# Patient Record
Sex: Female | Born: 1980
Health system: Southern US, Community
[De-identification: ages and names within clinical notes are randomized; demographics above are authoritative.]

## PROBLEM LIST (undated history)

## (undated) ENCOUNTER — Inpatient Hospital Stay (HOSPITAL_COMMUNITY): Payer: Self-pay

## (undated) DIAGNOSIS — Z789 Other specified health status: Secondary | ICD-10-CM

## (undated) DIAGNOSIS — IMO0002 Reserved for concepts with insufficient information to code with codable children: Secondary | ICD-10-CM

## (undated) DIAGNOSIS — Z8279 Family history of other congenital malformations, deformations and chromosomal abnormalities: Secondary | ICD-10-CM

## (undated) DIAGNOSIS — K649 Unspecified hemorrhoids: Secondary | ICD-10-CM

## (undated) DIAGNOSIS — O24419 Gestational diabetes mellitus in pregnancy, unspecified control: Secondary | ICD-10-CM

## (undated) DIAGNOSIS — Z98891 History of uterine scar from previous surgery: Secondary | ICD-10-CM

## (undated) DIAGNOSIS — A630 Anogenital (venereal) warts: Secondary | ICD-10-CM

## (undated) DIAGNOSIS — D62 Acute posthemorrhagic anemia: Secondary | ICD-10-CM

## (undated) DIAGNOSIS — K08409 Partial loss of teeth, unspecified cause, unspecified class: Secondary | ICD-10-CM

## (undated) HISTORY — PX: WISDOM TOOTH EXTRACTION: SHX21

---

## 2000-12-17 ENCOUNTER — Other Ambulatory Visit: Admission: RE | Admit: 2000-12-17 | Discharge: 2000-12-17 | Payer: Self-pay | Admitting: Obstetrics and Gynecology

## 2001-04-20 ENCOUNTER — Encounter (INDEPENDENT_AMBULATORY_CARE_PROVIDER_SITE_OTHER): Payer: Self-pay

## 2001-04-20 ENCOUNTER — Other Ambulatory Visit: Admission: RE | Admit: 2001-04-20 | Discharge: 2001-04-20 | Payer: Self-pay | Admitting: Obstetrics and Gynecology

## 2002-05-18 ENCOUNTER — Other Ambulatory Visit: Admission: RE | Admit: 2002-05-18 | Discharge: 2002-05-18 | Payer: Self-pay | Admitting: Obstetrics and Gynecology

## 2003-04-22 ENCOUNTER — Other Ambulatory Visit: Admission: RE | Admit: 2003-04-22 | Discharge: 2003-04-22 | Payer: Self-pay | Admitting: Obstetrics and Gynecology

## 2004-03-26 ENCOUNTER — Ambulatory Visit (HOSPITAL_COMMUNITY): Admission: RE | Admit: 2004-03-26 | Discharge: 2004-03-26 | Payer: Self-pay | Admitting: Obstetrics and Gynecology

## 2004-03-28 ENCOUNTER — Other Ambulatory Visit: Admission: RE | Admit: 2004-03-28 | Discharge: 2004-03-28 | Payer: Self-pay | Admitting: Obstetrics and Gynecology

## 2004-10-12 ENCOUNTER — Inpatient Hospital Stay (HOSPITAL_COMMUNITY): Admission: AD | Admit: 2004-10-12 | Discharge: 2004-10-14 | Payer: Self-pay | Admitting: Obstetrics and Gynecology

## 2006-05-26 ENCOUNTER — Encounter: Admission: RE | Admit: 2006-05-26 | Discharge: 2006-07-10 | Payer: Self-pay | Admitting: Obstetrics and Gynecology

## 2006-07-11 ENCOUNTER — Inpatient Hospital Stay (HOSPITAL_COMMUNITY): Admission: AD | Admit: 2006-07-11 | Discharge: 2006-07-14 | Payer: Self-pay | Admitting: Obstetrics and Gynecology

## 2007-12-17 ENCOUNTER — Encounter: Admission: RE | Admit: 2007-12-17 | Discharge: 2007-12-17 | Payer: Self-pay | Admitting: Obstetrics and Gynecology

## 2007-12-29 ENCOUNTER — Encounter: Admission: RE | Admit: 2007-12-29 | Discharge: 2007-12-29 | Payer: Self-pay | Admitting: Obstetrics and Gynecology

## 2008-01-20 ENCOUNTER — Encounter: Admission: RE | Admit: 2008-01-20 | Discharge: 2008-01-20 | Payer: Self-pay | Admitting: Obstetrics and Gynecology

## 2008-08-08 ENCOUNTER — Ambulatory Visit: Payer: Self-pay | Admitting: Internal Medicine

## 2009-06-01 ENCOUNTER — Inpatient Hospital Stay (HOSPITAL_COMMUNITY): Admission: AD | Admit: 2009-06-01 | Discharge: 2009-06-01 | Payer: Self-pay | Admitting: Obstetrics and Gynecology

## 2009-06-09 ENCOUNTER — Inpatient Hospital Stay (HOSPITAL_COMMUNITY): Admission: AD | Admit: 2009-06-09 | Discharge: 2009-06-11 | Payer: Self-pay | Admitting: Obstetrics and Gynecology

## 2010-11-11 ENCOUNTER — Encounter: Payer: Self-pay | Admitting: Obstetrics and Gynecology

## 2011-01-26 LAB — CBC
HCT: 23.9 % — ABNORMAL LOW (ref 36.0–46.0)
Platelets: 388 10*3/uL (ref 150–400)
RBC: 3.52 MIL/uL — ABNORMAL LOW (ref 3.87–5.11)
RDW: 18.8 % — ABNORMAL HIGH (ref 11.5–15.5)
RDW: 18.8 % — ABNORMAL HIGH (ref 11.5–15.5)
WBC: 13.6 10*3/uL — ABNORMAL HIGH (ref 4.0–10.5)
WBC: 14 10*3/uL — ABNORMAL HIGH (ref 4.0–10.5)

## 2011-01-26 LAB — RPR: RPR Ser Ql: NONREACTIVE

## 2011-03-08 NOTE — Op Note (Signed)
NAME:  Danielle Lambert, Danielle Lambert NO.:  [id number]   MEDICAL RECORD NO.:  [id number]          PATIENT TYPE:  INP   LOCATION:  9132                          FACILITY:  [facility]   PHYSICIAN:  Maxie Better, M.D.DATE OF BIRTH:  06-30-1981   DATE OF PROCEDURE:  07/11/2006  DATE OF DISCHARGE:  07/14/2006                                 OPERATIVE REPORT   PREOPERATIVE DIAGNOSIS:  1. Fetal macrosomia.  2. Class A1 gestational diabetes.  3. Term gestation.   PROCEDURE:  1. Primary cesarean section  Kerr hysterotomy.   POSTOP DIAGNOSIS:  1. Fetal macrosomia.  2. Class A1 gestational diabetes.  3. Term gestation.   ANESTHESIA:  Spinal.   SURGEON:  Maxie Better, M.D.   ASSISTANT:  Marlinda Mike, C.N.M.   INDICATIONS:  A 30 year old gravida 2, para 1 female with class A1  gestational diabetes at term with an ultrasound estimated fetal weight of  4462 grams who now presents for surgical management to decrease her risk of  shoulder dystocia.  Risks and benefits of the procedure have been explained  to the patient's; consent was signed; the patient was transferred to the  operating room.   DESCRIPTION OF PROCEDURE:  Under adequate spinal anesthesia the patient was  placed in the supine position with a left lateral tilt.  She was sterilely  prepped and draped in the usual fashion.  An indwelling Foley catheter was  sterilely placed.  A Pfannenstiel skin incision was then made after 1/4%  Marcaine was injected.  The incision was carried down to the rectus fascia.  The rectus fascia was opened transversely.  The rectus fascia was then  bluntly and sharply dissected off the rectus muscle in the superior and  inferior fashion.  The rectus muscles were entered in the midline.  The  parietal peritoneum was entered sharply and extended superiorly and  inferiorly.   The lower uterine segment which was well-developed was palpated.  The  vesicouterine peritoneum was opened  transversely.  The bladder was then  bluntly dissected off the lower uterine segment and displaced inferiorly.  A  curvilinear low-transverse uterine incision was then made, extended with  bandage scissors.  Artificial rupture of membranes was performed.  Clear  amniotic fluid was noted.  Subsequent delivery of a live female was then  accomplished.  The baby was bulb suctioned on the abdomen; the cord was  clamped, cut.  The baby was transferred to the awaiting pediatrician who  assigned Apgars of 9 and 9 at one and five minutes.  Weight of the baby was  8 pounds 14 ounces.  The placenta was manually removed.  Uterine cavity was  then cleaned of debris.  Uterine incision had no extension.   Uterine incision was closed in two layers; the first layer with #0 Monocryl  running locked stitch.  The second layer was imbricating  using #0 Monocryl  suture.  Good hemostasis was subsequently noted.  Normal tubes and ovaries  were noted bilaterally.  The abdomen was copiously irrigated and suctioned  of debris.  The parietal peritoneum was closed with 2-0 Vicryl.  The rectus  fascia was closed with #0 Vicryl x2.  The subcutaneous areas irrigated,  small bleeders cauterized, interrupted 2-0 plain sutures were then placed;  and Ethicon staples were then used to approximate the incision.  Specimen  was placenta not sent PT pathology.  Estimated blood loss was 700 mL.  Intraoperative fluid was 3 liters.  Urine output was 100 mL clear yellow  urine.  Sponge and sponge counts x2 were correct.  Complications none.  The  patient tolerated the procedure well; was transferred to recovery in stable  condition.      Maxie Better, M.D.  Electronically Signed     /MEDQ  D:  07/11/2006  T:  07/14/2006  Job:  [id number]

## 2011-03-08 NOTE — Discharge Summary (Signed)
NAME:  Danielle Lambert, Danielle Lambert NO.:  [id number]   MEDICAL RECORD NO.:  [id number]          PATIENT TYPE:  INP   LOCATION:  9132                          FACILITY:  [facility]   PHYSICIAN:  Maxie Better, M.D.DATE OF BIRTH:  Mar 06, 1981   DATE OF ADMISSION:  07/11/2006  DATE OF DISCHARGE:  07/14/2006                                 DISCHARGE SUMMARY   ADMISSION DIAGNOSES:  1. Fetal macrosomia.  2. Class A-1 gestational diabetes.  3. Iron-deficiency anemia.  4. Intrauterine gestation at 39 weeks.  5. Iron-deficiency anemia   DISCHARGE DIAGNOSES:  1. Term gestation delivered.  2. Class A-1 gestational diabetes.  3. Fetal macrosomia.  4. Iron-deficiency anemia.   PROCEDURE:  1. Primary cesarean section.  2. Sharl Ma hysterotomy.   HISTORY OF PRESENT ILLNESS:  A 30 year old gravida 2, para 1-0-0-1 female at  29 weeks' with class A-1 gestational diabetes, diet-controlled, who was  found by ultrasound to have an estimated fetal weight of 4462 g.   HOSPITAL COURSE:  Based on the ultrasound findings, the patient was admitted  to [facility] for a primary cesarean section as a result of her  gestational diabetes.  The patient underwent an uncomplicated primary  cesarean section.  The procedure resulted in the delivery of an 8 pound 14  ounce live female with Apgar of 9 and 9.  The patient had an estimated blood  loss of 700 cc.  She had an admission hematocrit of 29.2, hemoglobin 9.6,  white count of 11.1, platelet count of 369,000.  Postoperatively, her  hemoglobin was 7.2.  Repeat CBC subsequently showed a level of 7.8 on her  hemoglobin.  The patient initially had some dizziness with positional  change.  She has, though, complained of some fatigue.  The patient otherwise  was tolerating a regular diet, had passed flatus.  On postop day #1, the  patient was started on iron supplementation.  The patient subsequently did  not require a blood transfusion.  On postop day #3,  the patient had no  evidence of tachycardia.  Her incision showed no areas of erythema,  induration, or exudate, and having ambulated without assistance or  dizziness, the patient was deemed well to be discharged home.   DISPOSITION:  Home.   CONDITION:  Stable.   DISCHARGE MEDICATIONS:  1. Ibuprofen 800 mg one every 8 hours p.r.n. pain.  2. Percocet 1 to 2 tablets every 4 to 6 hours p.r.n. pain.  3. Prenatal vitamins 1 p.o. daily.  4. Niferex 150 mg 1 p.o. b.i.d.   Followup appointment with OB/GYN in 6 weeks.   DISCHARGE INSTRUCTIONS:  Per the postpartum book I have given.  The patient  will have a 2-hour glucose tolerance test at 8 weeks' postpartum.      Maxie Better, M.D.  Electronically Signed     [place]/MEDQ  D:  08/20/2006  T:  08/21/2006  Job:  [id number]

## 2011-04-22 LAB — ABO/RH: RH Type: POSITIVE

## 2011-04-22 LAB — HIV ANTIBODY (ROUTINE TESTING W REFLEX): HIV: NONREACTIVE

## 2011-04-22 LAB — RUBELLA ANTIBODY, IGM: Rubella: IMMUNE

## 2011-05-06 ENCOUNTER — Other Ambulatory Visit: Payer: Self-pay | Admitting: Obstetrics and Gynecology

## 2011-05-06 DIAGNOSIS — R1011 Right upper quadrant pain: Secondary | ICD-10-CM

## 2011-05-06 LAB — GC/CHLAMYDIA PROBE AMP, GENITAL
Chlamydia: NEGATIVE
Gonorrhea: NEGATIVE

## 2011-05-07 ENCOUNTER — Ambulatory Visit
Admission: RE | Admit: 2011-05-07 | Discharge: 2011-05-07 | Disposition: A | Discharge status: Home / Self Care | Payer: BC Managed Care – PPO | Source: Ambulatory Visit | Attending: Obstetrics and Gynecology | Admitting: Obstetrics and Gynecology

## 2011-05-07 DIAGNOSIS — R1011 Right upper quadrant pain: Secondary | ICD-10-CM

## 2011-10-22 NOTE — L&D Delivery Note (Signed)
Delivery Note At 1:18 PM a viable and healthy female was delivered via Vaginal, Spontaneous Delivery (Presentation: Left Occiput Posterior).  APGAR: 9, 9; weight 8 lb 6.2 oz (3805 g).   Placenta status: Intact, Spontaneous.  Cord: 3 vessels with the following complications: None.  Cord pH: none  Anesthesia: Epidural  Episiotomy: None Lacerations: None Suture Repair: none Est. Blood Loss (mL): 200cc  Mom to postpartum.  Baby to nursery-stable.  Adelfa Lozito A 11/15/2011, 4:30 PM

## 2011-11-15 ENCOUNTER — Encounter (HOSPITAL_COMMUNITY): Payer: Self-pay | Admitting: Anesthesiology

## 2011-11-15 ENCOUNTER — Encounter (HOSPITAL_COMMUNITY): Payer: Self-pay | Admitting: *Deleted

## 2011-11-15 ENCOUNTER — Encounter (HOSPITAL_COMMUNITY)
Admission: AD | Disposition: A | Discharge status: Home / Self Care | Payer: Self-pay | Source: Home / Self Care | Attending: Obstetrics and Gynecology

## 2011-11-15 ENCOUNTER — Inpatient Hospital Stay (HOSPITAL_COMMUNITY): Payer: BC Managed Care – PPO | Admitting: Anesthesiology

## 2011-11-15 ENCOUNTER — Inpatient Hospital Stay (HOSPITAL_COMMUNITY)
Admission: AD | Admit: 2011-11-15 | Discharge: 2011-11-17 | DRG: 373 | Disposition: A | Discharge status: Home / Self Care | Payer: BC Managed Care – PPO | Attending: Obstetrics and Gynecology | Admitting: Obstetrics and Gynecology

## 2011-11-15 DIAGNOSIS — D509 Iron deficiency anemia, unspecified: Secondary | ICD-10-CM | POA: Diagnosis present

## 2011-11-15 DIAGNOSIS — O9902 Anemia complicating childbirth: Secondary | ICD-10-CM | POA: Diagnosis present

## 2011-11-15 DIAGNOSIS — O34219 Maternal care for unspecified type scar from previous cesarean delivery: Secondary | ICD-10-CM | POA: Diagnosis present

## 2011-11-15 HISTORY — DX: Other specified health status: Z78.9

## 2011-11-15 LAB — CBC
HCT: 29.1 % — ABNORMAL LOW (ref 36.0–46.0)
MCH: 24.1 pg — ABNORMAL LOW (ref 26.0–34.0)
MCV: 78 fL (ref 78.0–100.0)
RBC: 3.73 MIL/uL — ABNORMAL LOW (ref 3.87–5.11)
WBC: 11.8 10*3/uL — ABNORMAL HIGH (ref 4.0–10.5)

## 2011-11-15 SURGERY — Surgical Case
Anesthesia: Regional

## 2011-11-15 MED ORDER — LIDOCAINE HCL (PF) 1 % IJ SOLN: 30.0000 mL | INTRAMUSCULAR | Status: DC | PRN

## 2011-11-15 MED ORDER — LACTATED RINGERS IV SOLN: INTRAVENOUS | Status: DC

## 2011-11-15 MED ORDER — IBUPROFEN 600 MG PO TABS
600.0000 mg | ORAL_TABLET | Freq: Four times a day (QID) | ORAL | Status: DC | PRN
  Administered 2011-11-15: 600 mg via ORAL
  Filled 2011-11-15: qty 1

## 2011-11-15 MED ORDER — OXYCODONE-ACETAMINOPHEN 5-325 MG PO TABS: 2.0000 | ORAL_TABLET | ORAL | Status: DC | PRN

## 2011-11-15 MED ORDER — MORPHINE SULFATE 0.5 MG/ML IJ SOLN
INTRAMUSCULAR | Status: AC
  Filled 2011-11-15: qty 10

## 2011-11-15 MED ORDER — PHENYLEPHRINE 40 MCG/ML (10ML) SYRINGE FOR IV PUSH (FOR BLOOD PRESSURE SUPPORT): 80.0000 ug | PREFILLED_SYRINGE | INTRAVENOUS | Status: DC | PRN

## 2011-11-15 MED ORDER — LACTATED RINGERS IV SOLN: 500.0000 mL | INTRAVENOUS | Status: DC | PRN

## 2011-11-15 MED ORDER — FLEET ENEMA 7-19 GM/118ML RE ENEM: 1.0000 | ENEMA | RECTAL | Status: DC | PRN

## 2011-11-15 MED ORDER — OXYTOCIN BOLUS FROM INFUSION
500.0000 mL | Freq: Once | INTRAVENOUS | Status: DC
  Filled 2011-11-15: qty 500

## 2011-11-15 MED ORDER — OXYTOCIN 20 UNITS IN LACTATED RINGERS INFUSION - SIMPLE
1.0000 m[IU]/min | INTRAVENOUS | Status: DC
  Administered 2011-11-15: 2 m[IU]/min via INTRAVENOUS
  Filled 2011-11-15: qty 1000

## 2011-11-15 MED ORDER — CITRIC ACID-SODIUM CITRATE 334-500 MG/5ML PO SOLN
30.0000 mL | ORAL | Status: DC | PRN
  Administered 2011-11-15: 30 mL via ORAL

## 2011-11-15 MED ORDER — ONDANSETRON HCL 4 MG/2ML IJ SOLN
INTRAMUSCULAR | Status: AC
  Filled 2011-11-15: qty 2

## 2011-11-15 MED ORDER — TERBUTALINE SULFATE 1 MG/ML IJ SOLN: 0.2500 mg | Freq: Once | INTRAMUSCULAR | Status: DC | PRN

## 2011-11-15 MED ORDER — DIPHENHYDRAMINE HCL 25 MG PO CAPS: 25.0000 mg | ORAL_CAPSULE | Freq: Four times a day (QID) | ORAL | Status: DC | PRN

## 2011-11-15 MED ORDER — WITCH HAZEL-GLYCERIN EX PADS: 1.0000 "application " | MEDICATED_PAD | CUTANEOUS | Status: DC | PRN

## 2011-11-15 MED ORDER — IBUPROFEN 600 MG PO TABS: 600.0000 mg | ORAL_TABLET | Freq: Four times a day (QID) | ORAL | Status: DC | PRN

## 2011-11-15 MED ORDER — DIPHENHYDRAMINE HCL 50 MG/ML IJ SOLN: 12.5000 mg | INTRAMUSCULAR | Status: DC | PRN

## 2011-11-15 MED ORDER — DIBUCAINE 1 % RE OINT: 1.0000 "application " | TOPICAL_OINTMENT | RECTAL | Status: DC | PRN

## 2011-11-15 MED ORDER — PRENATAL MULTIVITAMIN CH
1.0000 | ORAL_TABLET | Freq: Every day | ORAL | Status: DC
  Administered 2011-11-16 – 2011-11-17 (×2): 1 via ORAL
  Filled 2011-11-15: qty 1

## 2011-11-15 MED ORDER — LACTATED RINGERS IV SOLN: 500.0000 mL | Freq: Once | INTRAVENOUS | Status: DC

## 2011-11-15 MED ORDER — ONDANSETRON HCL 4 MG/2ML IJ SOLN: 4.0000 mg | Freq: Four times a day (QID) | INTRAMUSCULAR | Status: DC | PRN

## 2011-11-15 MED ORDER — TETANUS-DIPHTH-ACELL PERTUSSIS 5-2.5-18.5 LF-MCG/0.5 IM SUSP
0.5000 mL | Freq: Once | INTRAMUSCULAR | Status: AC
  Administered 2011-11-16: 0.5 mL via INTRAMUSCULAR
  Filled 2011-11-15: qty 0.5

## 2011-11-15 MED ORDER — EPHEDRINE 5 MG/ML INJ: 10.0000 mg | INTRAVENOUS | Status: DC | PRN

## 2011-11-15 MED ORDER — CITRIC ACID-SODIUM CITRATE 334-500 MG/5ML PO SOLN
30.0000 mL | ORAL | Status: DC | PRN
  Filled 2011-11-15: qty 15

## 2011-11-15 MED ORDER — LACTATED RINGERS IV SOLN
INTRAVENOUS | Status: DC
  Administered 2011-11-15 (×2): via INTRAVENOUS

## 2011-11-15 MED ORDER — SODIUM BICARBONATE 8.4 % IV SOLN
INTRAVENOUS | Status: DC | PRN
  Administered 2011-11-15: 10 mL via EPIDURAL

## 2011-11-15 MED ORDER — SIMETHICONE 80 MG PO CHEW: 80.0000 mg | CHEWABLE_TABLET | ORAL | Status: DC | PRN

## 2011-11-15 MED ORDER — OXYTOCIN 10 UNIT/ML IJ SOLN
INTRAMUSCULAR | Status: AC
  Filled 2011-11-15: qty 2

## 2011-11-15 MED ORDER — BENZOCAINE-MENTHOL 20-0.5 % EX AERO: 1.0000 "application " | INHALATION_SPRAY | CUTANEOUS | Status: DC | PRN

## 2011-11-15 MED ORDER — SENNOSIDES-DOCUSATE SODIUM 8.6-50 MG PO TABS
2.0000 | ORAL_TABLET | Freq: Every day | ORAL | Status: DC
  Administered 2011-11-15 – 2011-11-16 (×2): 2 via ORAL

## 2011-11-15 MED ORDER — EPHEDRINE 5 MG/ML INJ
10.0000 mg | INTRAVENOUS | Status: DC | PRN
  Filled 2011-11-15: qty 4

## 2011-11-15 MED ORDER — ACETAMINOPHEN 325 MG PO TABS: 650.0000 mg | ORAL_TABLET | ORAL | Status: DC | PRN

## 2011-11-15 MED ORDER — OXYTOCIN 20 UNITS IN LACTATED RINGERS INFUSION - SIMPLE: 125.0000 mL/h | Freq: Once | INTRAVENOUS | Status: DC

## 2011-11-15 MED ORDER — BENZOCAINE-MENTHOL 20-0.5 % EX AERO
INHALATION_SPRAY | CUTANEOUS | Status: AC
  Filled 2011-11-15: qty 56

## 2011-11-15 MED ORDER — SODIUM BICARBONATE 8.4 % IV SOLN
INTRAVENOUS | Status: AC
  Filled 2011-11-15: qty 50

## 2011-11-15 MED ORDER — FENTANYL 2.5 MCG/ML BUPIVACAINE 1/10 % EPIDURAL INFUSION (WH - ANES)
14.0000 mL/h | INTRAMUSCULAR | Status: DC
  Administered 2011-11-15: 16 mL/h via EPIDURAL
  Filled 2011-11-15 (×2): qty 60

## 2011-11-15 MED ORDER — LIDOCAINE HCL 1.5 % IJ SOLN
INTRAMUSCULAR | Status: DC | PRN
  Administered 2011-11-15 (×3): 4 mL via INTRADERMAL

## 2011-11-15 MED ORDER — LACTATED RINGERS IV SOLN
INTRAVENOUS | Status: DC | PRN
  Administered 2011-11-15 (×2): via INTRAVENOUS

## 2011-11-15 MED ORDER — PROPOFOL 10 MG/ML IV EMUL
INTRAVENOUS | Status: AC
  Filled 2011-11-15: qty 20

## 2011-11-15 MED ORDER — OXYTOCIN 20 UNITS IN LACTATED RINGERS INFUSION - SIMPLE
125.0000 mL/h | Freq: Once | INTRAVENOUS | Status: AC
  Administered 2011-11-15: 125 mL/h via INTRAVENOUS

## 2011-11-15 MED ORDER — OXYCODONE-ACETAMINOPHEN 5-325 MG PO TABS
1.0000 | ORAL_TABLET | ORAL | Status: DC | PRN
  Administered 2011-11-15 – 2011-11-16 (×4): 1 via ORAL
  Filled 2011-11-15 (×4): qty 1

## 2011-11-15 MED ORDER — ONDANSETRON HCL 4 MG PO TABS: 4.0000 mg | ORAL_TABLET | ORAL | Status: DC | PRN

## 2011-11-15 MED ORDER — OXYTOCIN 20 UNITS IN LACTATED RINGERS INFUSION - SIMPLE: 1.0000 m[IU]/min | INTRAVENOUS | Status: DC

## 2011-11-15 MED ORDER — LIDOCAINE-EPINEPHRINE (PF) 2 %-1:200000 IJ SOLN
INTRAMUSCULAR | Status: AC
  Filled 2011-11-15: qty 20

## 2011-11-15 MED ORDER — PHENYLEPHRINE 40 MCG/ML (10ML) SYRINGE FOR IV PUSH (FOR BLOOD PRESSURE SUPPORT)
80.0000 ug | PREFILLED_SYRINGE | INTRAVENOUS | Status: DC | PRN
  Filled 2011-11-15: qty 5

## 2011-11-15 MED ORDER — LIDOCAINE HCL (PF) 1 % IJ SOLN
30.0000 mL | INTRAMUSCULAR | Status: DC | PRN
  Filled 2011-11-15: qty 30

## 2011-11-15 MED ORDER — OXYCODONE-ACETAMINOPHEN 5-325 MG PO TABS
2.0000 | ORAL_TABLET | ORAL | Status: DC | PRN
  Filled 2011-11-15: qty 1

## 2011-11-15 MED ORDER — IBUPROFEN 600 MG PO TABS
600.0000 mg | ORAL_TABLET | Freq: Four times a day (QID) | ORAL | Status: DC
  Administered 2011-11-15 – 2011-11-17 (×5): 600 mg via ORAL
  Filled 2011-11-15 (×5): qty 1

## 2011-11-15 MED ORDER — FENTANYL CITRATE 0.05 MG/ML IJ SOLN
INTRAMUSCULAR | Status: AC
  Filled 2011-11-15: qty 2

## 2011-11-15 MED ORDER — FENTANYL 2.5 MCG/ML BUPIVACAINE 1/10 % EPIDURAL INFUSION (WH - ANES)
14.0000 mL/h | INTRAMUSCULAR | Status: DC
  Administered 2011-11-15: 14 mL/h via EPIDURAL
  Filled 2011-11-15 (×2): qty 60

## 2011-11-15 MED ORDER — LANOLIN HYDROUS EX OINT: TOPICAL_OINTMENT | CUTANEOUS | Status: DC | PRN

## 2011-11-15 MED ORDER — ONDANSETRON HCL 4 MG/2ML IJ SOLN: 4.0000 mg | INTRAMUSCULAR | Status: DC | PRN

## 2011-11-15 MED ORDER — ZOLPIDEM TARTRATE 5 MG PO TABS: 5.0000 mg | ORAL_TABLET | Freq: Every evening | ORAL | Status: DC | PRN

## 2011-11-15 SURGICAL SUPPLY — 42 items
APL SKNCLS STERI-STRIP NONHPOA (GAUZE/BANDAGES/DRESSINGS)
BENZOIN TINCTURE PRP APPL 2/3 (GAUZE/BANDAGES/DRESSINGS) IMPLANT
CHLORAPREP W/TINT 26ML (MISCELLANEOUS) ×2 IMPLANT
CLOTH BEACON ORANGE TIMEOUT ST (SAFETY) ×2 IMPLANT
CONTAINER PREFILL 10% NBF 15ML (MISCELLANEOUS) IMPLANT
DRESSING TELFA 8X3 (GAUZE/BANDAGES/DRESSINGS) ×2 IMPLANT
ELECT REM PT RETURN 9FT ADLT (ELECTROSURGICAL) ×2
ELECTRODE REM PT RTRN 9FT ADLT (ELECTROSURGICAL) ×1 IMPLANT
EXTRACTOR VACUUM KIWI (MISCELLANEOUS) IMPLANT
EXTRACTOR VACUUM M CUP 4 TUBE (SUCTIONS) IMPLANT
GAUZE SPONGE 4X4 12PLY STRL LF (GAUZE/BANDAGES/DRESSINGS) ×4 IMPLANT
GLOVE BIO SURGEON STRL SZ 6.5 (GLOVE) ×2 IMPLANT
GLOVE BIOGEL PI IND STRL 7.0 (GLOVE) ×2 IMPLANT
GLOVE BIOGEL PI INDICATOR 7.0 (GLOVE) ×2
GOWN PREVENTION PLUS LG XLONG (DISPOSABLE) ×6 IMPLANT
KIT ABG SYR 3ML LUER SLIP (SYRINGE) IMPLANT
NDL HYPO 25X1 1.5 SAFETY (NEEDLE) ×1 IMPLANT
NDL HYPO 25X5/8 SAFETYGLIDE (NEEDLE) IMPLANT
NEEDLE HYPO 25X1 1.5 SAFETY (NEEDLE) ×2 IMPLANT
NEEDLE HYPO 25X5/8 SAFETYGLIDE (NEEDLE) IMPLANT
NS IRRIG 1000ML POUR BTL (IV SOLUTION) ×2 IMPLANT
PACK C SECTION WH (CUSTOM PROCEDURE TRAY) ×2 IMPLANT
PAD ABD 7.5X8 STRL (GAUZE/BANDAGES/DRESSINGS) ×2 IMPLANT
RTRCTR C-SECT PINK 25CM LRG (MISCELLANEOUS) IMPLANT
SLEEVE SCD COMPRESS KNEE MED (MISCELLANEOUS) IMPLANT
STAPLER VISISTAT 35W (STAPLE) IMPLANT
STRIP CLOSURE SKIN 1/2X4 (GAUZE/BANDAGES/DRESSINGS) IMPLANT
SUT CHROMIC GUT AB #0 18 (SUTURE) IMPLANT
SUT MNCRL 0 VIOLET CTX 36 (SUTURE) ×3 IMPLANT
SUT MON AB 4-0 PS1 27 (SUTURE) IMPLANT
SUT MONOCRYL 0 CTX 36 (SUTURE) ×3
SUT PLAIN 2 0 (SUTURE)
SUT PLAIN ABS 2-0 CT1 27XMFL (SUTURE) IMPLANT
SUT VIC AB 0 CT1 27 (SUTURE) ×4
SUT VIC AB 0 CT1 27XBRD ANBCTR (SUTURE) ×2 IMPLANT
SUT VIC AB 2-0 CT1 27 (SUTURE) ×2
SUT VIC AB 2-0 CT1 TAPERPNT 27 (SUTURE) ×1 IMPLANT
SUT VICRYL 0 TIES 12 18 (SUTURE) IMPLANT
SYR CONTROL 10ML LL (SYRINGE) ×2 IMPLANT
TOWEL OR 17X24 6PK STRL BLUE (TOWEL DISPOSABLE) ×4 IMPLANT
TRAY FOLEY CATH 14FR (SET/KITS/TRAYS/PACK) IMPLANT
WATER STERILE IRR 1000ML POUR (IV SOLUTION) ×2 IMPLANT

## 2011-11-15 NOTE — Anesthesia Preprocedure Evaluation (Signed)
Anesthesia Evaluation  Patient identified by MRN, date of birth, ID band Patient awake    Reviewed: Allergy & Precautions, H&P , NPO status , Patient's Chart, lab work & pertinent test results, reviewed documented beta blocker date and time   History of Anesthesia Complications Negative for: history of anesthetic complications  Airway Mallampati: I TM Distance: >3 FB Neck ROM: full    Dental  (+) Teeth Intact   Pulmonary neg pulmonary ROS,  clear to auscultation        Cardiovascular neg cardio ROS regular Normal    Neuro/Psych Negative Neurological ROS  Negative Psych ROS   GI/Hepatic negative GI ROS, Neg liver ROS,   Endo/Other  Negative Endocrine ROS  Renal/GU negative Renal ROS     Musculoskeletal   Abdominal   Peds  Hematology negative hematology ROS (+)   Anesthesia Other Findings   Reproductive/Obstetrics (+) Pregnancy (h/o c/s with 2nd baby, has had once successful VBAC)                           Anesthesia Physical Anesthesia Plan  ASA: II  Anesthesia Plan: Epidural   Post-op Pain Management:    Induction:   Airway Management Planned:   Additional Equipment:   Intra-op Plan:   Post-operative Plan:   Informed Consent: I have reviewed the patients History and Physical, chart, labs and discussed the procedure including the risks, benefits and alternatives for the proposed anesthesia with the patient or authorized representative who has indicated his/her understanding and acceptance.     Plan Discussed with:   Anesthesia Plan Comments:         Anesthesia Quick Evaluation

## 2011-11-15 NOTE — Anesthesia Postprocedure Evaluation (Addendum)
  Anesthesia Post-op Note  Patient: Danielle Lambert  Procedure(s) Performed:  Vaginal Delivery  Patient Location: Mother/Baby  Anesthesia Type: Epidural  Level of Consciousness: alert  and oriented  Airway and Oxygen Therapy: Patient Spontanous Breathing  Post-op Pain: mild  Post-op Assessment: Patient's Cardiovascular Status Stable and Respiratory Function Stable  Post-op Vital Signs: stable  Complications: No apparent anesthesia complications

## 2011-11-15 NOTE — Progress Notes (Signed)
"  I have been contracting all day, but they got worse around 2000.  I was in Dr. Cherly Hensen office on Wednesday and was 3cm."

## 2011-11-15 NOTE — OR Nursing (Signed)
Pt  in OR  as Stat C/S fetal heartrate 63 upon entering OR. Dr Cherly Hensen pushed pt prolapsed cord back up in to uterus. After a minute fetal heartrate went up to 127. After watching baby heart rate for 5 minutes,it remained up and pt was stable. Dr Cherly Hensen decided to take pt back to L /D for labor.During pt OR visit she was prepped for foley catheter and compression stockings were placed on her.

## 2011-11-15 NOTE — Transfer of Care (Signed)
Anesthesia Post Note  Patient: Danielle Lambert  Procedure(s) Performed:  None - brought to OR for STAT C/S for cord prolapse.  Cord prolapse was reduced and FHR was reassuring, so C/S was cancelled and patient returned to L&D to continue labor in stable condition.  Anesthesia type: Epidural  Patient location: L&D  Post pain: Pain level controlled  Post assessment: Post-op Vital signs reviewed  Post vital signs: stable  Level of consciousness: awake  Complications: No apparent anesthesia complications

## 2011-11-15 NOTE — Plan of Care (Signed)
Problem: Consults Goal: Birthing Suites Patient Information Press F2 to bring up selections list Outcome: Completed/Met Date Met:  11/15/11  Pt 37-[redacted] weeks EGA

## 2011-11-15 NOTE — Progress Notes (Signed)
Danielle Lambert is a 31 y.o. [phone] at [redacted]w[redacted]d by LMP admitted for active labor  Subjective: Chief Complaint  Patient presents with  . Labor Eval   Epidural. Objective: BP 120/60  Pulse 82  Temp(Src) 98.2 F (36.8 C) (Oral)  Resp 18  Ht 5\' 10"  (1.778 m)  Wt 109.317 kg (241 lb)  BMI 34.58 kg/m2  SpO2 100%   Total I/O In: 600 [I.V.:600] Out: -   FHT:  FHR: 130 bpm, variability: moderate,  accelerations:  Present,  decelerations:  Absent UC:   irregular, every 4-6 minutes SVE:   4-5/80/-2 Tracing: reactive  Labs: Lab Results  Component Value Date   WBC 11.8* 11/15/2011   HGB 9.0* 11/15/2011   HCT 29.1* 11/15/2011   MCV 78.0 11/15/2011   PLT 259 11/15/2011    Assessment / Plan: Protracted active phase  P) Amniotomy. Pitocin  Anticipated MOD:  NSVD  Anna-Marie Coller A 11/15/2011, 10:15 AM

## 2011-11-15 NOTE — Progress Notes (Signed)
Danielle Lambert is a 31 y.o. [phone] at [redacted]w[redacted]d by LMP admitted for active labor.   Subjective: Chief Complaint  Patient presents with  . Labor Eval  Epidural  Objective: BP 120/60  Pulse 82  Temp(Src) 98.2 F (36.8 C) (Oral)  Resp 18  Ht 5\' 10"  (1.778 m)  Wt 109.317 kg (241 lb)  BMI 34.58 kg/m2  SpO2 100%   Total I/O In: 600 [I.V.:600] Out: -   FHT:  FHR: 130 bpm, variability: moderate,  accelerations:  Present,  decelerations:  Absent UC:   irregular, every 4-6 minutes SVE:   4-5/80/-2 palp membranes Tracing: reactive  Labs: Lab Results  Component Value Date   WBC 11.8* 11/15/2011   HGB 9.0* 11/15/2011   HCT 29.1* 11/15/2011   MCV 78.0 11/15/2011   PLT 259 11/15/2011    Assessment / Plan: Protracted active phase   Anticipated MOD:  NSVD  Donnajean Chesnut A 11/15/2011, 9:54 AM    addendum: AROM based on above exam @ 9:30 am. Copious clear amniotic fluid noted. Kept hand inside vagina due to amount of fluid. While waiting. Cord transverse and just below head was palpated. Once confirmed,  It was unclear as to heart rate with external monitor. Decision made to push cord back up ,heart rate noted to be in the 70"s and the cord again was palpated. OR notified of need for C/S. ISE placed. Pt transferred to OR with head still displaced and cord again reduced. In OR, cord was not palp and vtx applied to cervix well. FHR was back up in to 120's and c/s put on hold ( epidural was dosed) . Pt express desire not to have a C/S. Pt was then monitored further in the OR. IUPC placed. With reassuring tracing and pt's desire, pt was transferred back to ROOM with plans for amnioinfusion and pitocin augmentation.

## 2011-11-15 NOTE — H&P (Signed)
  Cc: ctx  HPI; 30yo G4P3 MWF now @ 38 5/[redacted] wk gestation presents in active labor. Hx notable for prev C/S with successful VBAC. (+) intact membrane Pt ambulated x 1hr then had cervical change. Uncomplicated prenatal care  PMH: NKDA  no latex allergy  Meds: PNV  medical neg\ Surgical hx: C/S 2nd to GDM and fetal macrosomia OB: SVD x 2, C/S x 1  PNC: Apos, antibody screen neg. GBS cx neg. Rubella Immune, hep B SAg neg, RPR NR. Nl 1hr GCT  FH noncontributory SH: married nonsmoker 3 girls ROS neg  PE: WDWN  Gravid female in labor  Skin(-)lesion Heent: anicteric sclera pink conjunctiva oropharnyx neg  Cor: RRR Lungs: clear to A Abd : gravid pfannensteil skin incision Pelvic 4-5/70/-2 Extr: tr edema  Tracing: reactive baseline 130  Ctx q 4-6 mins  IMP: Active labor IUP @ 38 5/7 wk P) admit epidural. Amniotomy prn pitocin prn

## 2011-11-15 NOTE — Anesthesia Procedure Notes (Signed)
Epidural Patient location during procedure: OB Start time: 11/15/2011 7:10 AM Reason for block: procedure for pain  Staffing Performed by: anesthesiologist   Preanesthetic Checklist Completed: patient identified, site marked, surgical consent, pre-op evaluation, timeout performed, IV checked, risks and benefits discussed and monitors and equipment checked  Epidural Patient position: sitting Prep: site prepped and draped and DuraPrep Patient monitoring: continuous pulse ox and blood pressure Approach: midline Injection technique: LOR air  Needle:  Needle type: Tuohy  Needle gauge: 17 G Needle length: 9 cm Needle insertion depth: 5 cm cm Catheter type: closed end flexible Catheter size: 19 Gauge Catheter at skin depth: 10 cm Test dose: negative  Assessment Events: blood not aspirated, injection not painful, no injection resistance, negative IV test and no paresthesia  Additional Notes Discussed risk of headache, infection, bleeding, nerve injury and failed or incomplete block.  Patient voices understanding and wishes to proceed.

## 2011-11-15 NOTE — Progress Notes (Signed)
S: comfortable. Epidural C/S dosing still in place  Pitocin  O: VE: per RN unchanged  Tracing: baseline 135  (+) ctx irreg. IUPC in place Amnioinfusion   IMP: Active phase Previous C/S w/ successful VBAC P) close fetal monitoring. Pitocin augmentation

## 2011-11-16 LAB — CBC
MCV: 79.6 fL (ref 78.0–100.0)
Platelets: 185 10*3/uL (ref 150–400)
RBC: 3.28 MIL/uL — ABNORMAL LOW (ref 3.87–5.11)
RDW: 17.8 % — ABNORMAL HIGH (ref 11.5–15.5)
WBC: 9 10*3/uL (ref 4.0–10.5)

## 2011-11-16 MED ORDER — DOCUSATE SODIUM 100 MG PO CAPS
100.0000 mg | ORAL_CAPSULE | Freq: Every day | ORAL | Status: DC
  Administered 2011-11-16 – 2011-11-17 (×2): 100 mg via ORAL
  Filled 2011-11-16 (×2): qty 1

## 2011-11-16 MED ORDER — HYDROCORTISONE ACE-PRAMOXINE 1-1 % RE CREA
TOPICAL_CREAM | Freq: Three times a day (TID) | RECTAL | Status: DC
  Filled 2011-11-16: qty 30

## 2011-11-16 MED ORDER — POLYSACCHARIDE IRON 150 MG PO CAPS
150.0000 mg | ORAL_CAPSULE | Freq: Every day | ORAL | Status: DC
  Administered 2011-11-16 – 2011-11-17 (×2): 150 mg via ORAL
  Filled 2011-11-16 (×2): qty 1

## 2011-11-16 MED ORDER — HYDROCORTISONE ACE-PRAMOXINE 1-1 % RE FOAM
1.0000 | Freq: Three times a day (TID) | RECTAL | Status: DC
  Administered 2011-11-16 (×2): 1 via RECTAL
  Filled 2011-11-16: qty 10

## 2011-11-16 MED ORDER — OXYCODONE-ACETAMINOPHEN 5-325 MG PO TABS
1.0000 | ORAL_TABLET | ORAL | Status: DC | PRN
  Administered 2011-11-16 – 2011-11-17 (×3): 1 via ORAL
  Filled 2011-11-16 (×3): qty 1

## 2011-11-16 NOTE — Progress Notes (Signed)
Patient ID: LENETTE RAU, female   DOB: 1981-08-23, 31 y.o.   MRN: [id number]  PPD 1 SVD- direct OP   S:  Reports feeling ok but pain in tailbone and anus today             Tolerating po/ No nausea or vomiting             Bleeding is light             Pain controlled with motrin and percocet             Up ad lib / ambulatory  Newborn breast feeding  / Circumcision done   O:  A & O x 3 NAD             VS: Blood pressure 115/74, pulse 63, temperature 97.8 F (36.6 C), temperature source Oral, resp. rate 20, height 5\' 10"  (1.778 m), weight 109.317 kg (241 lb), SpO2 97.00%, unknown if currently breastfeeding.  LABS: WBC/Hgb/Hct/Plts:  9.0/7.9/26.1/185 (01/26 0520)   Lungs: Clear and unlabored  Heart: regular rate and rhythm / no mumurs  Abdomen: soft, non-tender, non-distended              Fundus: firm, non-tender, U-1  Perineum: no edema / no hemorrhoids - ecchymosis around anus   Lochia: light  Extremities: no edema, no calf pain or tenderness    A: PPD # 1              Chronic iron deficiency anemia  Doing well - stable status  P:  Routine post partum orders  Heating pad to sacral region / analgesic cream to anus with hydrocortisone             Iron and colace  BAILEY,TANYA 11/16/2011, 10:04 AM

## 2011-11-17 MED ORDER — OXYCODONE-ACETAMINOPHEN 5-325 MG PO TABS: 1.0000 | ORAL_TABLET | ORAL | Status: AC | PRN

## 2011-11-17 MED ORDER — LIDOCAINE-HYDROCORTISONE ACE 2.8-0.55 % RE GEL: RECTAL | Status: DC

## 2011-11-17 MED ORDER — POLYSACCHARIDE IRON 150 MG PO CAPS: 150.0000 mg | ORAL_CAPSULE | Freq: Every day | ORAL | Status: DC

## 2011-11-17 MED ORDER — DSS 100 MG PO CAPS: 100.0000 mg | ORAL_CAPSULE | Freq: Every day | ORAL | Status: AC

## 2011-11-17 MED ORDER — IBUPROFEN 800 MG PO TABS: 800.0000 mg | ORAL_TABLET | Freq: Three times a day (TID) | ORAL | Status: AC | PRN

## 2011-11-17 NOTE — Progress Notes (Signed)
Patient ID: Danielle Lambert, female   DOB: January 16, 1981, 31 y.o.   MRN: [id number]   PPD 2 SVD  S:  Reports feeling well - ready for discharge / showered this am             Tolerating po/ No nausea or vomiting             Bleeding is light             Pain controlled withmotrin and percocet and heating pad             Right ligament pain with positioning last night - none this am / same tailbone pain             Up ad lib / ambulatory  Newborn breast feeding  / Circumcision done   O:  A & O x 3              VS: Blood pressure 105/65, pulse 69, temperature 97.7 F (36.5 C), temperature source Oral, resp. rate 18, height 5\' 10"  (1.778 m), weight 109.317 kg (241 lb), SpO2 97.00%, unknown if currently breastfeeding.  Lungs: Clear and unlabored  Heart: regular rate and rhythm   Abdomen: soft, non-tender, non-distended              Fundus: firm, non-tender, U-1  Perineum: no edema  Lochia: light  Extremities: no edema, no calf pain or tenderness    A: PPD # 2   Doing well - stable status  P:  Routine post partum orders  Discharge to home today - [facility] discharge booklet              Call office for PT referral if tailbone pain not resolved by 2 weeks or if pain worsens   Bennette Hasty 11/17/2011, 10:34 AM

## 2011-11-17 NOTE — Discharge Summary (Signed)
Obstetric Discharge Summary Reason for Admission: onset of labor Prenatal Procedures: none Intrapartum Procedures: occult cord prolapse after AROM / prolapse reduced in OR with reassuring FHR status /  transferred back to L&D for labor management & pitocin augmentation / successful repeat VBAC Postpartum Procedures: none Complications-Operative and Postpartum: no lacerations  / persistent tailbone pain postpartum (OP delivery) Hemoglobin  Date Value Range Status  11/16/2011 7.9* 12.0-15.0 (g/dL) Final     HCT  Date Value Range Status  11/16/2011 26.1* 36.0-46.0 (%) Final    Discharge Diagnoses: Term Pregnancy-delivered and Repeat VBAC and chronic iron deficiency anemia  Discharge Information: Date: 11/17/2011 Activity: pelvic rest Diet: routine Medications: PNV, Ibuprofen, Colace, Iron, Percocet and Rectagel Condition: stable Instructions: refer to practice specific booklet Discharge to: home Follow-up Information    Follow up with COUSINS,SHERONETTE A, MD. Schedule an appointment as soon as possible for a visit in 6 weeks. (  Call in 2 weeks if persistent pain in tailbone)    Contact information:   [address] [place] [address] [phone]         Repeat CBC and ferritin at postpartum visit for iron deficiency anemia  Newborn Data: Live born female  Birth Weight: 8 lb 6.2 oz (3805 g) APGAR: 9, 9  Home with mother.  Danielle Lambert 11/17/2011, 10:59 AM

## 2011-11-18 NOTE — Progress Notes (Signed)
Post discharge chart review completed.  

## 2011-11-20 ENCOUNTER — Encounter (HOSPITAL_COMMUNITY): Payer: Self-pay | Admitting: Obstetrics and Gynecology

## 2011-12-31 ENCOUNTER — Other Ambulatory Visit: Payer: Self-pay | Admitting: Obstetrics and Gynecology

## 2011-12-31 DIAGNOSIS — Z1231 Encounter for screening mammogram for malignant neoplasm of breast: Secondary | ICD-10-CM

## 2012-01-10 ENCOUNTER — Ambulatory Visit: Payer: BC Managed Care – PPO

## 2012-03-11 ENCOUNTER — Other Ambulatory Visit: Payer: Self-pay | Admitting: Obstetrics and Gynecology

## 2012-03-11 ENCOUNTER — Ambulatory Visit
Admission: RE | Admit: 2012-03-11 | Discharge: 2012-03-11 | Disposition: A | Discharge status: Home / Self Care | Payer: BC Managed Care – PPO | Source: Ambulatory Visit | Attending: Obstetrics and Gynecology | Admitting: Obstetrics and Gynecology

## 2012-03-11 DIAGNOSIS — Z1231 Encounter for screening mammogram for malignant neoplasm of breast: Secondary | ICD-10-CM

## 2012-06-15 ENCOUNTER — Ambulatory Visit
Admission: RE | Admit: 2012-06-15 | Discharge: 2012-06-15 | Disposition: A | Discharge status: Home / Self Care | Payer: BC Managed Care – PPO | Source: Ambulatory Visit | Attending: Obstetrics and Gynecology | Admitting: Obstetrics and Gynecology

## 2012-06-15 DIAGNOSIS — Z1231 Encounter for screening mammogram for malignant neoplasm of breast: Secondary | ICD-10-CM

## 2012-08-26 LAB — OB RESULTS CONSOLE HIV ANTIBODY (ROUTINE TESTING): HIV: NONREACTIVE

## 2012-08-26 LAB — OB RESULTS CONSOLE RPR: RPR: NONREACTIVE

## 2012-08-26 LAB — OB RESULTS CONSOLE RUBELLA ANTIBODY, IGM: Rubella: IMMUNE

## 2012-08-26 LAB — OB RESULTS CONSOLE HEPATITIS B SURFACE ANTIGEN: Hepatitis B Surface Ag: NEGATIVE

## 2012-09-02 LAB — OB RESULTS CONSOLE GC/CHLAMYDIA: Chlamydia: NEGATIVE

## 2012-09-04 ENCOUNTER — Other Ambulatory Visit (HOSPITAL_COMMUNITY): Payer: Self-pay | Admitting: Obstetrics and Gynecology

## 2012-09-04 ENCOUNTER — Other Ambulatory Visit: Payer: Self-pay

## 2012-09-04 DIAGNOSIS — IMO0002 Reserved for concepts with insufficient information to code with codable children: Secondary | ICD-10-CM

## 2012-09-04 DIAGNOSIS — O283 Abnormal ultrasonic finding on antenatal screening of mother: Secondary | ICD-10-CM

## 2012-09-08 ENCOUNTER — Other Ambulatory Visit: Payer: Self-pay

## 2012-09-08 ENCOUNTER — Encounter (HOSPITAL_COMMUNITY): Payer: Self-pay

## 2012-09-08 ENCOUNTER — Ambulatory Visit (HOSPITAL_COMMUNITY)
Admission: RE | Admit: 2012-09-08 | Discharge: 2012-09-08 | Disposition: A | Discharge status: Home / Self Care | Payer: BC Managed Care – PPO | Source: Ambulatory Visit | Attending: Obstetrics and Gynecology | Admitting: Obstetrics and Gynecology

## 2012-09-08 VITALS — BP 131/75 | HR 93

## 2012-09-08 DIAGNOSIS — Z1389 Encounter for screening for other disorder: Secondary | ICD-10-CM | POA: Insufficient documentation

## 2012-09-08 DIAGNOSIS — IMO0002 Reserved for concepts with insufficient information to code with codable children: Secondary | ICD-10-CM

## 2012-09-08 DIAGNOSIS — O283 Abnormal ultrasonic finding on antenatal screening of mother: Secondary | ICD-10-CM

## 2012-09-08 DIAGNOSIS — O358XX0 Maternal care for other (suspected) fetal abnormality and damage, not applicable or unspecified: Secondary | ICD-10-CM | POA: Insufficient documentation

## 2012-09-08 DIAGNOSIS — Z363 Encounter for antenatal screening for malformations: Secondary | ICD-10-CM | POA: Insufficient documentation

## 2012-09-08 DIAGNOSIS — O09299 Supervision of pregnancy with other poor reproductive or obstetric history, unspecified trimester: Secondary | ICD-10-CM | POA: Insufficient documentation

## 2012-09-08 NOTE — Consult Note (Signed)
Maternal Fetal Medicine Consultation  Requesting Provider(s): Maxie Better, MD  Reason for consultation: Fetal cystic hygroma  HPI: Danielle Lambert is a 31 yo G5P4004 currently at 5 1/7 weeks by dates who is referred due to suspected cystic hygroma.  Prenatal course has otherwise been uncomplicated.  OB History: OB History    Grav Para Term Preterm Abortions TAB SAB Ect Mult Living   5 4 4       4     Prior C-section for suspected fetal macrosomia Term SVD x 3 without complications  PMH:  Past Medical History  Diagnosis Date  . No pertinent past medical history     PSH:  Past Surgical History  Procedure Date  . Cesarean section   . Cesarean section 11/15/2011    Procedure: CESAREAN SECTION;  Surgeon: Serita Kyle, MD;  Location: [facility];  Service: Gynecology;  Laterality: N/A;   Meds: Prenatal vitamins  Allergies: NKDA  FH: neg for birth defects and hereditary disorders  Soc: Denies tobacco, ETOH or illicit drugs  Review of Systems: no vaginal bleeding or cramping/contractions, no LOF, no nausea/vomiting. All other systems reviewed and are negative.  PNL: A+, abs screen neg, HBSA neg, RPR NR, HIV neg   PE:   Filed Vitals:   09/08/12 1152  BP: 131/75  Pulse: 93    GEN: well-appearing female ABD: gravid, NT  Ultrasound:  Ultrasound reveals a non-septated cystic hygroma.  The nuchal fold is approximately 8 cm and the cystic hygroma extends to the front of the neck.  There is a 1-2 week growth lag based on LMP - the patient has a certain LMP and would be hesitant to change dates given the fetal anomalies.  Based on dates, the long bones appear shortened. A small echogenic focus is noted in the LUQ of the fetal abdomen - likely of no clinical significance.  The fetus is female.  There is no evidence of fetal hydrops. The amnion is not fused - this finding has been associated with aneuploidy.  Limited views of the fetal heart were obtained due to early  gestational age.  Single IUP at 17 1/7 weeks by dates and 15 2/7 weeks by ultrasound today Non-septated cystic hygroma No evidence of fetal hydrops Fetal LUQ abdominal calcification - clinical significance uncertain Amnion not fused Possible shortened long bones  A/P: 1) Cystic hygroma - ultrasound findings reviewed with couple.  The fact that there is no evidence of hydrops at this time improves the prognosis somewhat.  The patient reports a certain LMP and based on this, there is a 1-2 week growth lag.  Although this might represent a dating issue, would be concerned about early IUGR.  We also discussed the finding of a non-fused amnion - this has been reported as a marker for aneuploidy.  The patient elected to undergo cell free fetal DNA testing ([name] test).  Recommend follow up in 3 weeks for interval growth. If fetal growth over that time is appropriate, would revise dates.  We reviewed the association of congenital heart defects and cystic hygroma - will schedule fetal echo for approximately 20-22 weeks as well.   Thank you for the opportunity to be a part of the care of [name]. Please contact our office if we can be of further assistance.   I spent approximately 30 minutes with this patient with over 50% of time spent in face-to-face counseling.  Alpha Gula, MD

## 2012-09-08 NOTE — Progress Notes (Signed)
Genetic Counseling  High-Risk Gestation Note  Appointment Date:  09/08/2012 Referred By: Serita Kyle, * Date of Birth:  [birth year] Partner:  Danielle Lambert    Pregnancy History: [id number] Estimated Date of Delivery: 02/15/13 Estimated Gestational Age: [redacted]w[redacted]d Attending: Alpha Gula, MD  [name] and her husband, Mr. Danielle Lambert, were seen for genetic counseling because of a previous ultrasound finding of cystic hygroma.   We discussed the ultrasound finding of cystic hygroma in detail with the couple.  They were counseled that a cystic hygroma describes a fluid filled sac at the back of the neck that typically results from failure of the fetal lymphatic system.  We discussed the various etiologies for a hygroma including normal variation (immature lymphatic system), a chromosome condition, single gene condition, or a congenital anomaly (heart defect).  We discussed chromosomes, non-disjunction, age related risks for aneuploidy, and the approximate 50% ultrasound adjusted risk for a chromosome condition based on the finding of a cystic hygroma.  We discussed the common features of Down syndrome, Turner syndrome (in female fetuses), and trisomies 45 and 85.  They were counseled regarding the option of amniocentesis for detection of fetal aneuploidy.  The risks, benefits, and limitations of this procedure were reviewed in detail.  A risk of 1 in 300-500 was given for amniocentesis, the primary complication being spontaneous pregnancy loss.  We reviewed other available screening options including noninvasive prenatal testing (NIPT). Specifically, we discussed that NIPT analyzes cell free fetal DNA found in the maternal circulation. This test is not diagnostic for chromosome conditions, but can provide information regarding the presence or absence of extra fetal DNA for chromosomes 13, 18 and 21 as well as X and Y. Thus, it would not identify or rule out all fetal aneuploidy or  assess for single gene conditions.  The reported detection rate is greater than 99% for Trisomy 21, greater than 97% for Trisomy 18, and is approximately 80% (8 out of 10) for Trisomy 13. The false positive rate is reported to be less than 0.1% for any of these conditions. In addition, we discussed that ~50-80% of fetuses with Down syndrome and up to 90-95% of fetuses with trisomy 18/13, when well visualized, have detectable anomalies or soft markers by detailed ultrasound (~18+ weeks gestation).   We also discussed that a cystic hygroma can be a feature of an underlying single gene condition, such as Noonan syndrome.  We discussed that prenatal diagnosis is available for some single gene conditions, but that in most cases targeted analysis is recommended by a medical geneticist following a post-natal physical exam and review of medical records.  If however, ultrasound findings or a positive family history strongly suggest an increased risk for a specific condition, testing may be performed prenatally.  Additionally, we discussed that congenital heart defects are commonly associated with cystic hygromas.  We reviewed the availability of a fetal echocardiogram in the pregnancy.  We discussed that the fetal prognosis is largely dependent upon the underlying cause of the hygroma, but that there are indications by ultrasound, such as hydrops, which significantly increase the likelihood of a poor prognosis.  They understand that cystic hygromas may worsen and progress to hydrops fetalis, improve and regress, or remain unchanged at follow up ultrasounds.   Targeted ultrasound performed at the time of today's visit visualized non-septated cystic hygroma with no evidence of hydrops. Amnion was visualized to not yet be fused and possible shortened long bones (depending upon the correct gestational dating for  the pregnancy), which would increase the chance for underlying aneuploidy. After careful consideration, the  couple elected to proceed with cell free fetal DNA testing ([name]) and declined amniocentesis at the time of today's visit. Follow-up ultrasound was scheduled in 3 weeks to assess fetal growth and fetal echocardiogram was scheduled in 4 weeks.   [name] was provided with written information regarding cystic fibrosis (CF) including the carrier frequency and incidence in the Caucasian population, the availability of carrier testing and prenatal diagnosis if indicated.  In addition, we discussed that CF is routinely screened for as part of the [place] newborn screening panel.  She declined CF screening today.   Both family histories were reviewed and found to be contributory for a nephew of Mr. Danielle Lambert with autism (his sister's son). An underlying etiology has not been determined for his autism. We discussed that autism is part of the spectrum of conditions referred to as Autistic spectrum disorders (ASD). We discussed that ASDs are among the most common neurodevelopmental disorders, with approximately 1 in 88 children meeting criteria for ASD. Approximately 80% of individuals diagnosed are female. There is strong evidence that genetic factors play a critical role in development of ASD. There have been recent advances in identifying specific genetic causes of ASD, however, there are still many individuals for whom the etiology of the ASD is not known. Once a family has a child with a diagnosis of ASD, there is a 13.5% chance to have another child with ASD. Data are limited regarding recurrence risk for autism for extended degree relatives in the absence of an identified etiology. They understand that at this time there is not genetic testing available for ASD for most families.   Additionally, [name] reported that her mother was diagnosed with breast cancer in her 30s. She is currently healthy. Though most cancers are thought to be sporadic or due to environmental factors, some families appear to  have a strong predisposition to cancers.  When considering a family history of cancer, we look for common types of cancer in multiple family members occurring at younger than typical ages. [name] and/or her mother have the option of meeting with a cancer genetic counselor to discuss any possible screening or testing options available. If they are concerned about the family history of cancer and would like to learn more about the family's chance for an inherited cancer syndrome, her doctor may refer her or her relatives to [facility] [phone]).  Without further information regarding the provided family history, an accurate genetic risk cannot be calculated. Further genetic counseling is warranted if more information is obtained.  [name] denied exposure to environmental toxins or chemical agents. She denied the use of alcohol, tobacco or street drugs. She denied significant viral illnesses during the course of her pregnancy. Her medical and surgical histories were noncontributory.   I counseled this couple regarding the above risks and available options.  The approximate face-to-face time with the genetic counselor was 45 minutes.  Quinn Plowman, MS,  Certified Genetic Counselor 09/08/2012

## 2012-09-14 ENCOUNTER — Telehealth (HOSPITAL_COMMUNITY): Payer: Self-pay | Admitting: MS"

## 2012-09-14 NOTE — Telephone Encounter (Signed)
[name] called to check on status of NIPT ([name]) results. Reviewed that results take approximately 1 and a half to two weeks to result. Patient stated that they are scheduled to see pediatric cardiologist on 12/4 and was curious if results would be back by then. Discussed that I would expect result to be back by 12/4. I planned to check on status of lab on Wednesday, 11/27 and call patient with update given upcoming Thanksgiving holiday.   [name] 09/14/2012 10:14 AM

## 2012-09-16 ENCOUNTER — Telehealth (HOSPITAL_COMMUNITY): Payer: Self-pay | Admitting: MS"

## 2012-09-16 NOTE — Telephone Encounter (Signed)
Called Ms. Danielle Lambert to inform her that [name] (noninvasive prenatal testing) is currently pending. Would expect result Monday at the earliest. Unsure how Thanksgiving may affect turnaround time. I will plan to check on status and call patient on Monday with update.   Clydie Braun Drinda Belgard 09/16/2012 4:02 PM

## 2012-09-21 ENCOUNTER — Telehealth (HOSPITAL_COMMUNITY): Payer: Self-pay | Admitting: MS"

## 2012-09-21 NOTE — Telephone Encounter (Signed)
Called [name] to discuss results of NIPT (cell free fetal DNA) test results. NIPT was performed given previous ultrasound finding of cystic hygroma. The results of the NIPT revealed a greater than 99% chance of fetal Down syndrome. We discussed that this testing identifies >99% of pregnancies with Down syndrome with a false positive rate of 0.1%. We reviewed that this testing is highly sensitive and specific, but is not considered diagnostic. We discussed the availability of amniocentesis or peripheral blood chromosome analysis for confirmation of the diagnosis. Mrs. Laws reiterated that they are not likely interested in amniocentesis in pregnancy.   The patient inquired about impact of this information on her pregnancy management and delivery. We reviewed that delivery would not necessarily change given a positive NIPT Down syndrome result but that ultrasound findings could potentially affect delivery plan. We reviewed the increased chance for miscarriage in pregnancies with Down syndrome and increased risk for growth restriction. We discussed that serial ultrasounds for growth may be offered in pregnancy.   The patient is scheduled for fetal echocardiogram on Wednesday, 09/23/12. Additionally, she is scheduled for follow-up ultrasound on 09/29/12. We offered follow-up genetic counseling to further review these results and provide additional information regarding Down syndrome to the couple. [name] elected to have follow-up genetic counseling at the time of her 09/29/12 visit.    Additionally, NIPT indicated a low risk (less than 1 in 10,000) risk for trisomy 1 and trisomy 22. This testing identifies >98% of pregnancies with trisomy 18, and >80% with trisomy 13; the false positive rate is <0.1% for all conditions. Testing was also performed for X and Y chromosome analysis. This did not show evidence of aneuploidy for X or Y. It was also consistent with female gender. X and Y analysis has a  detection rate of approximately 99%. She understands that this testing does not identify all genetic conditions. All questions were answered to her satisfaction, she was encouraged to call with additional questions or concerns.  Danielle Plowman, MS Certified Genetic Counselor 09/21/2012 10:24 AM

## 2012-09-23 ENCOUNTER — Other Ambulatory Visit: Payer: Self-pay

## 2012-09-29 ENCOUNTER — Ambulatory Visit (HOSPITAL_COMMUNITY)
Admission: RE | Admit: 2012-09-29 | Discharge: 2012-09-29 | Disposition: A | Discharge status: Home / Self Care | Payer: BC Managed Care – PPO | Source: Ambulatory Visit | Attending: Obstetrics and Gynecology | Admitting: Obstetrics and Gynecology

## 2012-09-29 VITALS — BP 122/66 | HR 85 | Wt 216.0 lb

## 2012-09-29 DIAGNOSIS — IMO0002 Reserved for concepts with insufficient information to code with codable children: Secondary | ICD-10-CM

## 2012-09-29 DIAGNOSIS — O09299 Supervision of pregnancy with other poor reproductive or obstetric history, unspecified trimester: Secondary | ICD-10-CM | POA: Insufficient documentation

## 2012-09-29 DIAGNOSIS — O289 Unspecified abnormal findings on antenatal screening of mother: Secondary | ICD-10-CM | POA: Insufficient documentation

## 2012-09-29 DIAGNOSIS — O358XX0 Maternal care for other (suspected) fetal abnormality and damage, not applicable or unspecified: Secondary | ICD-10-CM | POA: Insufficient documentation

## 2012-09-29 NOTE — Progress Notes (Signed)
Danielle Lambert was seen for ultrasound appointment today.  Please see [facility] report for details.

## 2012-09-29 NOTE — Progress Notes (Addendum)
Genetic Counseling  High-Risk Gestation Note  Appointment Date:  09/29/2012 Referred By: Serita Kyle, * Date of Birth:  17-Oct-1981    Pregnancy History: [id number] Estimated Date of Delivery: 02/15/13 Estimated Gestational Age: [redacted]w[redacted]d Attending: Rema Fendt, MD  I met with [name] and her husband for follow-up genetic counseling because of an increased risk for fetal Down syndrome based on NIPT (cell free fetal DNA) test results. The couple was accompanied by Mr. Danielle Lambert's sister.   The results of the NIPT revealed a greater than 99% chance of fetal Down syndrome. We discussed that this testing identifies >99% of pregnancies with Down syndrome with a false positive rate of 0.1%. We reviewed that this testing is highly sensitive and specific, but is not considered diagnostic. They were counseled that given the ultrasound findings and the positive cell free fetal DNA test result, the suspected diagnosis of Down syndrome is likely accurate. We did review that the cell free fetal DNA test can not distinguish between aneuploidy confined to the placenta, trisomy 21, translocation 21, or mosaic trisomy 21. We discussed the availability of amniocentesis or postnatal peripheral blood chromosome analysis for confirmation of the diagnosis. This couple declined the option of amniocentesis. They wish to pursue postnatal confirmatory testing.   Considering the very high suspicion of Down syndrome, they were counseled in detail regarding this diagnosis. We discussed that there are different types of Down syndrome, and each type is determined by the arrangement of the #21 chromosomes. Approximately 95% of cases of Down syndrome are trisomy 21 and 2-4% are due to a translocation involving chromosome 21. We also reviewed mosaic Down syndrome. We reviewed chromosomes, nondisjunction, and that chromosome division errors happen by chance and are not usually inherited. They understand that confirmatory  testing will provide an actual karyotype and will help to determine accurate risks of recurrence for a future pregnancy.   This couple was counseled that Down syndrome occurs once per every 800 births and is associated with specific features. However, there is a high degree of variability seen among children who have this condition, meaning that every child with Down syndrome will not be affected in exactly the same way, and some children will have more or less features than others. They were counseled that approximately half of individuals with Down syndrome have a cardiac anomaly and ~10-15% have an intestinal difference. Other anomalies of various organ systems have also been described in association with this diagnosis. Danielle Lambert had fetal echocardiogram performed on 09/23/12. No structural fetal heart anomalies were visualized at that time.    We discussed that in general most individuals with Down syndrome have mild to moderate intellectual disability and likely will require extra assistance with school work. Approximately 70-80% of children with Down syndrome have hypotonia which may lead to delays in sitting, walking, and talking. Hypotonia does tend to improve with age and early intervention services such as physical, occupational, and speech therapies can help with achievement of developmental milestones. We discussed that these therapies are typically provided to children (with qualifying diagnoses) from birth until age 39. We also discussed that Down syndrome is associated with characteristic facial features. Because of these facial features, many children with Down syndrome look similar to each other, but they were reminded that each child with Down syndrome is unique and will have many more features in common with his or her own family members.   We also reviewed that the AAP have established health supervision guidelines for individuals  with Down syndrome. These guidelines provide medical  management recommendations for various stages of life and can be a resource for families who have a child with Down syndrome as well as for health professionals. We also discussed that providing children with Down syndrome with a stimulating physical and social environment, as well as ensuring that the child receives adequate medical care and appropriate therapies, will help these children to reach their full potential. With the advances in medical technology, early intervention, and supportive therapies, many individuals with Down syndrome are able to live with an increasing degree of independence. Today, many adults with Down syndrome care for themselves, have jobs, and often times live in group homes or apartments where assistance is available if needed.   We discussed local and national support organizations and offered to connect this couple with another couple who were given a prenatal diagnosis of Down syndrome. Additionally, we discussed that postnatal health management can be coordinated by a medical geneticist as well as with a multidisciplinary team of physicians (Down syndrome clinic). This couple was encouraged to contact us if they have any additional questions/concerns or if they need additional supportive or informational recommendations.   Follow-up ultrasound was performed at the time of today's visit. Complete ultrasound results reported separately.   The majority of time (>50%) was spent on counseling with this couple. The approximate face-to-face time with the genetic counselor was 40 minutes.   Quinn Plowman, MS Certified Genetic Counselor  09/29/2012

## 2012-09-30 ENCOUNTER — Ambulatory Visit (HOSPITAL_COMMUNITY): Payer: BC Managed Care – PPO

## 2012-10-15 ENCOUNTER — Inpatient Hospital Stay (HOSPITAL_COMMUNITY): Admission: RE | Admit: 2012-10-15 | Payer: BC Managed Care – PPO | Source: Ambulatory Visit

## 2012-10-21 NOTE — L&D Delivery Note (Signed)
Delivery Note At 8:41 PM a viable and healthy female was delivered via VBAC, Spontaneous (Presentation: ; Occiput Posterior).  APGAR: 9 ,9 ; weight 5pounds 8 oz .   Placenta status: Intact, Manual removal.  Cord:  with the following complications: none.  Cord pH: na  Anesthesia: Epidural  Episiotomy: None Lacerations: None Suture Repair: na Est. Blood Loss (mL): 300  Mom to postpartum.  Baby to nursery-stable NICU in attendance at delivery.  Efton Thomley J 02/01/2013, 8:54 PM

## 2012-10-26 ENCOUNTER — Other Ambulatory Visit (HOSPITAL_COMMUNITY): Payer: Self-pay | Admitting: Maternal and Fetal Medicine

## 2012-10-26 ENCOUNTER — Ambulatory Visit (HOSPITAL_COMMUNITY)
Admission: RE | Admit: 2012-10-26 | Discharge: 2012-10-26 | Disposition: A | Discharge status: Home / Self Care | Payer: BC Managed Care – PPO | Source: Ambulatory Visit | Attending: Obstetrics and Gynecology | Admitting: Obstetrics and Gynecology

## 2012-10-26 VITALS — BP 131/73

## 2012-10-26 DIAGNOSIS — IMO0002 Reserved for concepts with insufficient information to code with codable children: Secondary | ICD-10-CM

## 2012-10-26 DIAGNOSIS — O351XX0 Maternal care for (suspected) chromosomal abnormality in fetus, not applicable or unspecified: Secondary | ICD-10-CM

## 2012-10-26 DIAGNOSIS — O289 Unspecified abnormal findings on antenatal screening of mother: Secondary | ICD-10-CM | POA: Insufficient documentation

## 2012-10-26 DIAGNOSIS — O09299 Supervision of pregnancy with other poor reproductive or obstetric history, unspecified trimester: Secondary | ICD-10-CM | POA: Insufficient documentation

## 2012-10-26 DIAGNOSIS — O358XX0 Maternal care for other (suspected) fetal abnormality and damage, not applicable or unspecified: Secondary | ICD-10-CM | POA: Insufficient documentation

## 2012-10-26 NOTE — Progress Notes (Signed)
[facility] ultrasound  Indication: 32 yr old G5P4004 at [redacted]w[redacted]d with fetus with susptected trisomy 21 based on cell free fetal DNA screening and previous finding of pleural effusions for follow up ultrasound.  Findings: 1. Single intrauterine pregnancy. 2. Estimated fetal weight is in the 21st%.  3. Anterior placenta without evidence of previa. 4. Normal amniotic fluid volume although slightly decreased for gestational age. 5. Normal transabdominal cervical length. 6. The pleural effusions that were previously seen have resolved. 7. The stomach bubble is small. 8. The remainder of the limited anatomy survey is normal.  Recommendations: 1. Appropriate fetal growth. The femur and humerus continue to lag- consistent with trisomy 21. 2. Fetus with suspected trisomy 21: - previously counseled - had normal fetal echocardiogram 3. Previously seen pleural effusions: - previously counseled - resolved today - follow up in 2 weeks to reevaluate - likely had been secondary to lymphatic issues often seen with trisomy 21 4. Small stomach bubble: - discussed may be benign finding - may be indicative of esophageal atresia or tracheoesophageal fistula- although discussed these are usually associated with polyhydramnios; and a normal stomach bubble had been seen on previous ultrasounds - discussed adequate oral hydration - follow up in 2 weeks to reassess - also discussed association of duodenal atresia with trisomy 21 although this does not appear consistent with that as that usually presents with enlarged stomach bubble 5. At this point given resolution of pleural effusions do not see and increased risk at this time for preterm delivery therefore no indication for betamethasone 6. Will continue to follow  Eulis Foster, MD

## 2012-11-10 ENCOUNTER — Other Ambulatory Visit (HOSPITAL_COMMUNITY): Payer: Self-pay | Admitting: Obstetrics and Gynecology

## 2012-11-10 ENCOUNTER — Encounter (HOSPITAL_COMMUNITY): Payer: Self-pay

## 2012-11-10 ENCOUNTER — Ambulatory Visit (HOSPITAL_COMMUNITY)
Admission: RE | Admit: 2012-11-10 | Discharge: 2012-11-10 | Disposition: A | Discharge status: Home / Self Care | Payer: BC Managed Care – PPO | Source: Ambulatory Visit | Attending: Obstetrics and Gynecology | Admitting: Obstetrics and Gynecology

## 2012-11-10 VITALS — BP 122/75 | HR 93 | Wt 222.0 lb

## 2012-11-10 DIAGNOSIS — O351XX0 Maternal care for (suspected) chromosomal abnormality in fetus, not applicable or unspecified: Secondary | ICD-10-CM

## 2012-11-10 DIAGNOSIS — O09299 Supervision of pregnancy with other poor reproductive or obstetric history, unspecified trimester: Secondary | ICD-10-CM | POA: Insufficient documentation

## 2012-11-10 DIAGNOSIS — O358XX0 Maternal care for other (suspected) fetal abnormality and damage, not applicable or unspecified: Secondary | ICD-10-CM | POA: Insufficient documentation

## 2012-11-10 DIAGNOSIS — O289 Unspecified abnormal findings on antenatal screening of mother: Secondary | ICD-10-CM | POA: Insufficient documentation

## 2012-11-10 NOTE — Progress Notes (Signed)
Danielle Lambert  was seen today for an ultrasound appointment.  See full report in [facility].  [name] returns for follow up due to fetus with sustpected Trisomy 21 by NIPT.  Patient previously had a normal fetal echo.  Single IUP at 26 1/7 weeks The estimated fetal weight today is at the 17th %tile; the [facility] measures < 3rd %tile (Asymmetric growth) The stomach bubble was clearly visualized today and appears normal Abdominal calcification again noted - likely on no clinical consequence (artifact) No evidence of pleural effusion on today's study Normal umbilical artery Doppler studies for current gestational age Normal amniotic fluid volume  Recommend follow-up ultrasound examination in 3 weeks for interval growth  Alpha Gula, MD

## 2012-11-30 ENCOUNTER — Encounter (HOSPITAL_COMMUNITY): Payer: Self-pay

## 2012-11-30 ENCOUNTER — Ambulatory Visit (HOSPITAL_COMMUNITY)
Admission: RE | Admit: 2012-11-30 | Discharge: 2012-11-30 | Disposition: A | Discharge status: Home / Self Care | Payer: BC Managed Care – PPO | Source: Ambulatory Visit | Attending: Obstetrics and Gynecology | Admitting: Obstetrics and Gynecology

## 2012-11-30 VITALS — BP 130/72 | HR 90 | Wt 224.0 lb

## 2012-11-30 DIAGNOSIS — O09299 Supervision of pregnancy with other poor reproductive or obstetric history, unspecified trimester: Secondary | ICD-10-CM | POA: Insufficient documentation

## 2012-11-30 DIAGNOSIS — O358XX0 Maternal care for other (suspected) fetal abnormality and damage, not applicable or unspecified: Secondary | ICD-10-CM | POA: Insufficient documentation

## 2012-11-30 DIAGNOSIS — O351XX0 Maternal care for (suspected) chromosomal abnormality in fetus, not applicable or unspecified: Secondary | ICD-10-CM

## 2012-11-30 DIAGNOSIS — O289 Unspecified abnormal findings on antenatal screening of mother: Secondary | ICD-10-CM | POA: Insufficient documentation

## 2012-11-30 NOTE — Progress Notes (Signed)
Danielle Lambert  was seen today for an ultrasound appointment.  See full report in [facility].  Impression: Single IUP at 29 0/7 weeks Trisomy 21 by NIPT Interval fetal growth is appropriate (23rd %tile).  The [facility] today measures at the 15th %tile. Umbilical cord cyst noted near the placental insertion site Abdominal calcifications that were previously noted are not appreciated on today's study Pleural effusions not appreciated (resolved) Normal amniotic fluid volume  Recommendations: Recommend follow-up ultrasound examination in 3 weeks for interval growth Plan to begin antepartum fetal testing at that time.  Alpha Gula, MD

## 2012-12-01 ENCOUNTER — Ambulatory Visit (HOSPITAL_COMMUNITY): Payer: BC Managed Care – PPO

## 2012-12-21 ENCOUNTER — Ambulatory Visit (HOSPITAL_COMMUNITY)
Admission: RE | Admit: 2012-12-21 | Discharge: 2012-12-21 | Disposition: A | Discharge status: Home / Self Care | Payer: BC Managed Care – PPO | Source: Ambulatory Visit | Attending: Obstetrics and Gynecology | Admitting: Obstetrics and Gynecology

## 2012-12-21 DIAGNOSIS — O351XX0 Maternal care for (suspected) chromosomal abnormality in fetus, not applicable or unspecified: Secondary | ICD-10-CM

## 2012-12-21 DIAGNOSIS — O289 Unspecified abnormal findings on antenatal screening of mother: Secondary | ICD-10-CM | POA: Insufficient documentation

## 2012-12-21 DIAGNOSIS — O09299 Supervision of pregnancy with other poor reproductive or obstetric history, unspecified trimester: Secondary | ICD-10-CM | POA: Insufficient documentation

## 2012-12-21 DIAGNOSIS — O358XX0 Maternal care for other (suspected) fetal abnormality and damage, not applicable or unspecified: Secondary | ICD-10-CM | POA: Insufficient documentation

## 2012-12-29 ENCOUNTER — Other Ambulatory Visit (HOSPITAL_COMMUNITY): Payer: Self-pay | Admitting: Maternal and Fetal Medicine

## 2012-12-29 DIAGNOSIS — O358XX0 Maternal care for other (suspected) fetal abnormality and damage, not applicable or unspecified: Secondary | ICD-10-CM

## 2012-12-30 ENCOUNTER — Encounter (HOSPITAL_COMMUNITY): Payer: Self-pay

## 2012-12-30 ENCOUNTER — Ambulatory Visit (HOSPITAL_COMMUNITY)
Admission: RE | Admit: 2012-12-30 | Discharge: 2012-12-30 | Disposition: A | Discharge status: Home / Self Care | Payer: BC Managed Care – PPO | Source: Ambulatory Visit | Attending: Obstetrics and Gynecology | Admitting: Obstetrics and Gynecology

## 2012-12-30 DIAGNOSIS — O289 Unspecified abnormal findings on antenatal screening of mother: Secondary | ICD-10-CM | POA: Insufficient documentation

## 2012-12-30 DIAGNOSIS — O358XX0 Maternal care for other (suspected) fetal abnormality and damage, not applicable or unspecified: Secondary | ICD-10-CM | POA: Insufficient documentation

## 2012-12-30 DIAGNOSIS — O09299 Supervision of pregnancy with other poor reproductive or obstetric history, unspecified trimester: Secondary | ICD-10-CM | POA: Insufficient documentation

## 2012-12-30 NOTE — Progress Notes (Signed)
Danielle Lambert  was seen today for an ultrasound appointment.  See full report in [facility].  Impression: IUP at 33+2 weeks Fetus with trisomy 21 by NIPT Normal amniotic fluid volume  BPP 8/8 Thin-walled fluid filled cystic structure on fetal surface of placenta adjacent to placental cord insertion site; seen on prior scans; appears to be a benign finding  Recommendations: Continue weekly BPPs as scheduled. Follow up ultrasound for growth in 2 weeks.  Alpha Gula, MD

## 2013-01-05 ENCOUNTER — Other Ambulatory Visit (HOSPITAL_COMMUNITY): Payer: Self-pay | Admitting: Obstetrics and Gynecology

## 2013-01-05 DIAGNOSIS — O351XX1 Maternal care for (suspected) chromosomal abnormality in fetus, fetus 1: Secondary | ICD-10-CM

## 2013-01-05 DIAGNOSIS — IMO0001 Reserved for inherently not codable concepts without codable children: Secondary | ICD-10-CM

## 2013-01-06 ENCOUNTER — Ambulatory Visit (HOSPITAL_COMMUNITY)
Admission: RE | Admit: 2013-01-06 | Discharge: 2013-01-06 | Disposition: A | Discharge status: Home / Self Care | Payer: BC Managed Care – PPO | Source: Ambulatory Visit | Attending: Obstetrics and Gynecology | Admitting: Obstetrics and Gynecology

## 2013-01-06 DIAGNOSIS — O358XX0 Maternal care for other (suspected) fetal abnormality and damage, not applicable or unspecified: Secondary | ICD-10-CM | POA: Insufficient documentation

## 2013-01-06 DIAGNOSIS — O351XX1 Maternal care for (suspected) chromosomal abnormality in fetus, fetus 1: Secondary | ICD-10-CM

## 2013-01-06 DIAGNOSIS — O09299 Supervision of pregnancy with other poor reproductive or obstetric history, unspecified trimester: Secondary | ICD-10-CM | POA: Insufficient documentation

## 2013-01-06 DIAGNOSIS — O289 Unspecified abnormal findings on antenatal screening of mother: Secondary | ICD-10-CM | POA: Insufficient documentation

## 2013-01-06 DIAGNOSIS — IMO0001 Reserved for inherently not codable concepts without codable children: Secondary | ICD-10-CM

## 2013-01-07 ENCOUNTER — Other Ambulatory Visit (HOSPITAL_COMMUNITY): Payer: Self-pay | Admitting: Obstetrics and Gynecology

## 2013-01-07 DIAGNOSIS — IMO0001 Reserved for inherently not codable concepts without codable children: Secondary | ICD-10-CM

## 2013-01-09 ENCOUNTER — Encounter (HOSPITAL_COMMUNITY): Payer: Self-pay | Admitting: Family

## 2013-01-09 ENCOUNTER — Observation Stay (HOSPITAL_COMMUNITY)
Admission: AD | Admit: 2013-01-09 | Discharge: 2013-01-10 | Disposition: A | Discharge status: Home / Self Care | Payer: BC Managed Care – PPO | Source: Ambulatory Visit | Attending: Obstetrics and Gynecology | Admitting: Obstetrics and Gynecology

## 2013-01-09 DIAGNOSIS — O99019 Anemia complicating pregnancy, unspecified trimester: Secondary | ICD-10-CM | POA: Insufficient documentation

## 2013-01-09 DIAGNOSIS — O479 False labor, unspecified: Secondary | ICD-10-CM

## 2013-01-09 DIAGNOSIS — O3510X0 Maternal care for (suspected) chromosomal abnormality in fetus, unspecified, not applicable or unspecified: Secondary | ICD-10-CM | POA: Insufficient documentation

## 2013-01-09 DIAGNOSIS — O99340 Other mental disorders complicating pregnancy, unspecified trimester: Secondary | ICD-10-CM | POA: Insufficient documentation

## 2013-01-09 DIAGNOSIS — O34219 Maternal care for unspecified type scar from previous cesarean delivery: Secondary | ICD-10-CM | POA: Insufficient documentation

## 2013-01-09 DIAGNOSIS — D509 Iron deficiency anemia, unspecified: Secondary | ICD-10-CM | POA: Insufficient documentation

## 2013-01-09 DIAGNOSIS — F411 Generalized anxiety disorder: Secondary | ICD-10-CM | POA: Insufficient documentation

## 2013-01-09 DIAGNOSIS — O4703 False labor before 37 completed weeks of gestation, third trimester: Secondary | ICD-10-CM

## 2013-01-09 DIAGNOSIS — D649 Anemia, unspecified: Secondary | ICD-10-CM | POA: Diagnosis present

## 2013-01-09 DIAGNOSIS — O4700 False labor before 37 completed weeks of gestation, unspecified trimester: Principal | ICD-10-CM | POA: Insufficient documentation

## 2013-01-09 DIAGNOSIS — O351XX0 Maternal care for (suspected) chromosomal abnormality in fetus, not applicable or unspecified: Secondary | ICD-10-CM | POA: Insufficient documentation

## 2013-01-09 HISTORY — DX: Other specified health status: Z78.9

## 2013-01-09 LAB — CBC
HCT: 28.1 % — ABNORMAL LOW (ref 36.0–46.0)
Hemoglobin: 8.6 g/dL — ABNORMAL LOW (ref 12.0–15.0)
MCH: 22.9 pg — ABNORMAL LOW (ref 26.0–34.0)
MCHC: 30.6 g/dL (ref 30.0–36.0)
MCV: 74.9 fL — ABNORMAL LOW (ref 78.0–100.0)
Platelets: 262 10*3/uL (ref 150–400)
RBC: 3.75 MIL/uL — ABNORMAL LOW (ref 3.87–5.11)
RDW: 17.4 % — ABNORMAL HIGH (ref 11.5–15.5)
WBC: 12.8 10*3/uL — ABNORMAL HIGH (ref 4.0–10.5)

## 2013-01-09 LAB — TYPE AND SCREEN
ABO/RH(D): A POS
Antibody Screen: NEGATIVE

## 2013-01-09 LAB — URINALYSIS, ROUTINE W REFLEX MICROSCOPIC
Glucose, UA: NEGATIVE mg/dL
Ketones, ur: NEGATIVE mg/dL
Protein, ur: NEGATIVE mg/dL
pH: 6 (ref 5.0–8.0)

## 2013-01-09 LAB — URINE MICROSCOPIC-ADD ON

## 2013-01-09 MED ORDER — LACTATED RINGERS IV SOLN
INTRAVENOUS | Status: DC
  Administered 2013-01-09 (×2): via INTRAVENOUS

## 2013-01-09 MED ORDER — NALBUPHINE HCL 10 MG/ML IJ SOLN
5.0000 mg | INTRAMUSCULAR | Status: AC
  Administered 2013-01-09: 5 mg via INTRAVENOUS
  Filled 2013-01-09: qty 0.5

## 2013-01-09 MED ORDER — ACETAMINOPHEN 325 MG PO TABS: 650.0000 mg | ORAL_TABLET | ORAL | Status: DC | PRN

## 2013-01-09 MED ORDER — NIFEDIPINE 10 MG PO CAPS
20.0000 mg | ORAL_CAPSULE | Freq: Once | ORAL | Status: AC
  Administered 2013-01-09: 20 mg via ORAL
  Filled 2013-01-09: qty 2

## 2013-01-09 MED ORDER — ZOLPIDEM TARTRATE 5 MG PO TABS
5.0000 mg | ORAL_TABLET | Freq: Once | ORAL | Status: AC
  Administered 2013-01-10: 5 mg via ORAL
  Filled 2013-01-09: qty 1

## 2013-01-09 MED ORDER — LACTATED RINGERS IV BOLUS (SEPSIS)
300.0000 mL | Freq: Once | INTRAVENOUS | Status: AC
  Administered 2013-01-09: 300 mL via INTRAVENOUS

## 2013-01-09 MED ORDER — CALCIUM CARBONATE ANTACID 500 MG PO CHEW
2.0000 | CHEWABLE_TABLET | ORAL | Status: DC | PRN
  Filled 2013-01-09: qty 2

## 2013-01-09 MED ORDER — NIFEDIPINE 10 MG PO CAPS
10.0000 mg | ORAL_CAPSULE | Freq: Four times a day (QID) | ORAL | Status: DC
  Administered 2013-01-09: 10 mg via ORAL
  Filled 2013-01-09: qty 1

## 2013-01-09 MED ORDER — PRENATAL MULTIVITAMIN CH
1.0000 | ORAL_TABLET | Freq: Every day | ORAL | Status: DC
  Filled 2013-01-09: qty 1

## 2013-01-09 NOTE — Plan of Care (Signed)
Problem: Consults Goal: Birthing Suites Patient Information Press F2 to bring up selections list Outcome: Completed/Met Date Met:  01/09/13  Pt < [redacted] weeks EGA

## 2013-01-09 NOTE — MAU Note (Signed)
Patient presents to MAU with c/o intermittent contractions since 0000 today; reports good fetal movement; denies vaginal bleeding or LOF.  Patient is seen weekly at [facility] for trisomy 21 anomaly.

## 2013-01-09 NOTE — H&P (Signed)
  OB ADMISSION/ HISTORY & PHYSICAL:  Admission Date: 01/09/2013  3:47 PM  Admit Diagnosis: 34 5/7 threatened preterm labor / braxton-hicks contractions  Danielle Lambert is a 32 y.o. female presenting for irregular contractions since midnight last night.  Prenatal History: [id number]   EDC : 02/15/2013, by Last Menstrual Period  Prenatal care at [facility] Primary Ob Provider: Cherly Hensen Prenatal course complicated by trisomy -21 fetus - followed by [facility] / previous CS and VBAC / IDA  Medical / Surgical History :  Past medical history:  Past Medical History  Diagnosis Date  . No pertinent past medical history   . Medical history non-contributory      Past surgical history:  Past Surgical History  Procedure Laterality Date  . Cesarean section    . Cesarean section  11/15/2011    Procedure: CESAREAN SECTION;  Surgeon: Serita Kyle, MD;  Location: [facility];  Service: Gynecology;  Laterality: N/A;  . Wisdom tooth extraction      Family History: History reviewed. No pertinent family history.   Social History:  reports that she has never smoked. She has never used smokeless tobacco. She reports that she does not drink alcohol or use illicit drugs.  Allergies: Review of patient's allergies indicates no known allergies.   Current Medications at time of admission:  Prenatal  Iron  Review of Systems: Irregular ctx intermittent pattern since midnight last night Regular frequency this pm No LOF NO bleeding or discharge  Physical Exam:  VS: Blood pressure 124/85, pulse 113, temperature 97.9 F (36.6 C), temperature source Oral, resp. rate 18, last menstrual period 05/11/2012.  General: alert and oriented, appears anxious / NAD Heart: RRR Lungs: Clear lung fields Abdomen: Gravid, soft and non-tender, non-distended / uterus: gravid / non-tender Extremities: trace edema  Genitalia / VE: ext os loose 2cm funnels to FT / 50% / vtx / out of pelvis  FHR:  baseline rate 140 / variability decreased to moderate / accels 155-160 / decels none TOCO: 2-6 minutes 30-50 sec mild  Assessment: 34 5/[redacted] weeks gestation with Trisomy-21 fetus threatened PTL FHR category 1   Plan:  Observation IVF Procardia Ambien  Dr Billy Coast notified of admission & plan of care   Marlinda Mike CNM, MSN 01/09/2013, 8:16 PM

## 2013-01-09 NOTE — MAU Provider Note (Signed)
History     CSN: [id number]  Arrival date and time: 01/09/13 1547 Nurse call to provider - orders given @1623  Provider here to see patient @ 1720   No chief complaint on file.  HPI Irregular ctx since midnight - fell asleep but ctx still there when she woke up this am ctx stopped mid-morning then returned around 1pm - not painful but tight + intercourse last night No bleeding or discharge No LOF Very active FM  No hx preterm labor  - term deliveries 38-39 weeks with 5 hour labors on average  Past Medical History  Diagnosis Date  . No pertinent past medical history   . Medical history non-contributory     Past Surgical History  Procedure Laterality Date  . Cesarean section    . Cesarean section  11/15/2011    Procedure: CESAREAN SECTION;  Surgeon: Serita Kyle, MD;  Location: [facility];  Service: Gynecology;  Laterality: N/A;  . Wisdom tooth extraction      History reviewed. No pertinent family history.  History  Substance Use Topics  . Smoking status: Never Smoker   . Smokeless tobacco: Never Used  . Alcohol Use: No    Allergies: No Known Allergies  Prescriptions prior to admission  Medication Sig Dispense Refill  . ferrous sulfate 325 (65 FE) MG tablet Take 325 mg by mouth daily.      . Prenatal Vit-Fe Fumarate-FA (PRENATAL MULTIVITAMIN) TABS Take 1 tablet by mouth daily.        ROS Physical Exam   Blood pressure 124/85, pulse 113, temperature 97.9 F (36.6 C), temperature source Oral, resp. rate 18, last menstrual period 05/11/2012.  Physical Exam Comfortable and calm - talking with spouse Abdomen soft and non-tender VE: posterior cervix / soft / multiparous / external os loose 2 cm funnels to FT / 50% effaced         vtx not engaged in pelvis  No blood or discharge with exam  FHR: 140 baseline / + accels to 160 / no decels Toco: irregular ctx 2-12 minutes 30-50 sec / mild   MAU Course  Procedures  Urinalysis: spec gravity 1.005 / trace  heme and trace leukocytes    Assessment and Plan  34 5/7 weeks (trisomy-21 fetus - followed by MFM) Braxton-hicks ctx No evidence of labor onset  1) discharge to home 2) rest at home - maintain good water hydration 3) PTL precautions - most likely braxton-hick ctx / call if progression of any symptoms 4) OV Monday with Dr Michele Mcalpine 01/09/2013, 5:25 PM   Addendum @ [phone]  Patient and spouse prefer to stay at hospital and be observed longer for labor since they live so far from hospital - states she does not ever remember feeling ctx before labor in other pregnancies.  Ok to ambulate for next hour then recheck if she prefers   2000  Returned from ambulation - uterine activity every 2-5 minutes 30-60 seconds  patient reports ctx are much worse in past hour - talking with husband and provider thru contractions No bloody show - no discharge with BRP  No cervical change - ext loose 2cm funnels to FT / 50% / high station out of pelvis  Discussed with patient and spouse - reports ctx since 1pm today and NO cervical change in 7 hours Previous hx of 5 hour labors and ctx / cervix have not progressed today Reviewed braxton-hicks versus PTL   Recommendations due to gestational age and distance from  hospital with maternal concern for labor:  IV fluids and tocolytic medication - observe in MAU and if improvement can go home                                                         - if ctx persist will admit for observation until AM  Patient prefers to be observed until AM due to distance from hospital Consult with Dr Billy Coast - plan OBSERVATION tonight with IVF / procardia / Remus Loffler

## 2013-01-10 DIAGNOSIS — D649 Anemia, unspecified: Secondary | ICD-10-CM | POA: Diagnosis present

## 2013-01-10 LAB — ABO/RH: ABO/RH(D): A POS

## 2013-01-10 MED ORDER — PRENATAL MULTIVITAMIN CH: 1.0000 | ORAL_TABLET | Freq: Every day | ORAL | Status: DC

## 2013-01-10 MED ORDER — PRENATE ELITE 26-0.6-0.4 MG PO TABS: 1.0000 | ORAL_TABLET | Freq: Two times a day (BID) | ORAL | Status: DC

## 2013-01-10 MED ORDER — DOCUSATE SODIUM 100 MG PO CAPS: 100.0000 mg | ORAL_CAPSULE | Freq: Two times a day (BID) | ORAL | Status: DC

## 2013-01-10 MED ORDER — DSS 100 MG PO CAPS: 100.0000 mg | ORAL_CAPSULE | Freq: Two times a day (BID) | ORAL | Status: DC

## 2013-01-10 MED ORDER — PROMETHAZINE HCL 25 MG/ML IJ SOLN
25.0000 mg | INTRAMUSCULAR | Status: AC
  Administered 2013-01-10: 25 mg via INTRAVENOUS
  Filled 2013-01-10: qty 1

## 2013-01-10 MED ORDER — POLYSACCHARIDE IRON COMPLEX 150 MG PO CAPS
150.0000 mg | ORAL_CAPSULE | Freq: Two times a day (BID) | ORAL | Status: DC
  Filled 2013-01-10: qty 1

## 2013-01-10 MED ORDER — POLYSACCHARIDE IRON COMPLEX 150 MG PO CAPS: 150.0000 mg | ORAL_CAPSULE | Freq: Two times a day (BID) | ORAL | Status: DC

## 2013-01-10 NOTE — Discharge Summary (Signed)
Obstetric Discharge Summary  Reason for Admission: braxton-hicks contractions ( false labor) / threatened preterm labor Prenatal Procedures: NST and serial ultrasound - MFM (Trisomy-21) Complications: none  Hemoglobin  Date Value Range Status  01/09/2013 8.6* 12.0 - 15.0 g/dL Final     HCT  Date Value Range Status  01/09/2013 28.1* 36.0 - 46.0 % Final     Physical Exam:  General: alert, cooperative, no distress, pale and anxious Abdomen: soft and non-tender VE:  No cervical change in over 12 hours of observation  FHR: category 1  TOCO: UI with mild contractions  Discharge Diagnoses: False labor-undelivered and threatened preterm labor and severe iron deficiency anemia  Discharge Information: Date: 01/10/2013 Activity: unrestricted Diet: routine Medications: PNV, Colace, Iron and magnesium supplement Condition: stable Instructions: preterm labor precautions / braxton-hicks contractions Discharge to: home Follow-up Information   Follow up with COUSINS,SHERONETTE A, MD In 1 day.   Contact information:   [address] [place] [address] [phone]       Newborn Data: This patient has no babies on file. Home with mother.  Marlinda Mike 01/10/2013, 3:10 AM

## 2013-01-10 NOTE — Progress Notes (Signed)
S:  Call from nurse - patient unable to sleep (did not take medications) & feels nausea now with increased labor pain       sitting straight up in bed holding abdomen       states that she knows that she is in labor because she is still contracting and cant sleep (awake ~24 hour now)      declined nubain and ambien       no bleeding or LOF or discharge      constant cramping and cannot get comfortable due to cramps and contractions      O:  VS: Blood pressure 111/53, pulse 85, temperature 98.5 F (36.9 C), temperature source Oral, resp. rate 20, height 5\' 10"  (1.778 m), weight 104.327 kg (230 lb), last menstrual period 05/11/2012.        FHR : baseline 140 category 1        Toco: contractions irregular with UI        Cervix : no cervical change / vtx remains unengaged       A: Braxton-hicks contractions     FHR category 1     Maternal anxiety     Severe iron deficiency anemia of pregnancy  P: discussed with patient and spouse - NOT in labor  Preterm and term contraction (braxton-hicks) can be regular and persist for hours to weeks. Labor must have both contractions and cervical progression, so since there has been no cervical change in over 12 hours of observation / over 24 hours of contractions there is no labor. Acknowledged that ctx may be persistent and that she is uncomfortable - but not labor ctx and not in pain.   At 35 weeks - no indication for tocolytic. Explained we gave her a couple of doses for her comfort with little to no effect on her uterine activity. Will expect UI and irregular ctx until onset of labor. Reviewed true labor versus false labor signs to call.   Severe anemia needs treatment prior to delivery (question patient compliance with therapy) / reports PICA and eating ice constantly. Change of prenatal and Iron to BID with addition of magnesium 400 mg daily. Discussed anemia decreases oxygen carrying ability of blood cells - not uncommon to have more uterine  activity. Significant risk for transfusion with delivery - needs to be consistent with twice daily PNV and iron.   Recommend Ambien for sleep / will give phenergan for nausea now.   Offered to go home at this time and sleep in her own bed - reassured no evidence of labor OR can wait until AM then re-evaluate with most likely same advice. Elects to go home. Will take Ambien prior to discharge for sleep.     Marlinda Mike CNM, MSN 01/10/2013, [phone]

## 2013-01-10 NOTE — Progress Notes (Signed)
Pt ambulated off unit in stable condition with spouse at side for discharge.

## 2013-01-11 ENCOUNTER — Ambulatory Visit (HOSPITAL_COMMUNITY)
Admission: RE | Admit: 2013-01-11 | Discharge: 2013-01-11 | Disposition: A | Discharge status: Home / Self Care | Payer: Medicaid Other | Source: Ambulatory Visit | Attending: Obstetrics and Gynecology | Admitting: Obstetrics and Gynecology

## 2013-01-11 DIAGNOSIS — O358XX0 Maternal care for other (suspected) fetal abnormality and damage, not applicable or unspecified: Secondary | ICD-10-CM | POA: Insufficient documentation

## 2013-01-11 DIAGNOSIS — IMO0001 Reserved for inherently not codable concepts without codable children: Secondary | ICD-10-CM

## 2013-01-11 DIAGNOSIS — O09299 Supervision of pregnancy with other poor reproductive or obstetric history, unspecified trimester: Secondary | ICD-10-CM | POA: Insufficient documentation

## 2013-01-11 DIAGNOSIS — O289 Unspecified abnormal findings on antenatal screening of mother: Secondary | ICD-10-CM | POA: Insufficient documentation

## 2013-01-11 NOTE — Progress Notes (Signed)
Danielle Lambert  was seen today for an ultrasound appointment.  See full report in [facility].  Impression: IUP at 35 0/7  weeks Fetus with trisomy 21 by NIPT Interval growth is appropriate (24th %tile) Normal amniotic fluid volume  BPP 8/8 (at 34 minute mark) Thin-walled fluid filled cystic structure on fetal surface of placenta adjacent to placental cord insertion site; seen on prior scans; appears to be a benign finding  Recommendation: Due to extended NST (reassuring at 34  minute mark) - recommend NST.  This will be completed following the patient's prenatal visit today. Continue weekly BPPs. Recommend delivery at 39 weeks if not indicated earlier.  Alpha Gula, MD

## 2013-01-12 LAB — CULTURE, BETA STREP (GROUP B ONLY)

## 2013-01-12 LAB — OB RESULTS CONSOLE GBS: GBS: NEGATIVE

## 2013-01-13 ENCOUNTER — Other Ambulatory Visit (HOSPITAL_COMMUNITY): Payer: Self-pay | Admitting: Maternal and Fetal Medicine

## 2013-01-13 DIAGNOSIS — O289 Unspecified abnormal findings on antenatal screening of mother: Secondary | ICD-10-CM

## 2013-01-18 ENCOUNTER — Ambulatory Visit (HOSPITAL_COMMUNITY)
Admission: RE | Admit: 2013-01-18 | Discharge: 2013-01-18 | Disposition: A | Discharge status: Home / Self Care | Payer: BC Managed Care – PPO | Source: Ambulatory Visit | Attending: Obstetrics and Gynecology | Admitting: Obstetrics and Gynecology

## 2013-01-18 VITALS — BP 122/70 | HR 88 | Wt 229.2 lb

## 2013-01-18 DIAGNOSIS — O289 Unspecified abnormal findings on antenatal screening of mother: Secondary | ICD-10-CM

## 2013-01-18 DIAGNOSIS — O358XX0 Maternal care for other (suspected) fetal abnormality and damage, not applicable or unspecified: Secondary | ICD-10-CM | POA: Insufficient documentation

## 2013-01-18 DIAGNOSIS — O09299 Supervision of pregnancy with other poor reproductive or obstetric history, unspecified trimester: Secondary | ICD-10-CM | POA: Insufficient documentation

## 2013-01-18 DIAGNOSIS — O352XX1 Maternal care for (suspected) hereditary disease in fetus, fetus 1: Secondary | ICD-10-CM

## 2013-01-25 ENCOUNTER — Ambulatory Visit (HOSPITAL_COMMUNITY)
Admission: RE | Admit: 2013-01-25 | Discharge: 2013-01-25 | Disposition: A | Discharge status: Home / Self Care | Payer: BC Managed Care – PPO | Source: Ambulatory Visit | Attending: Obstetrics and Gynecology | Admitting: Obstetrics and Gynecology

## 2013-01-25 DIAGNOSIS — O09299 Supervision of pregnancy with other poor reproductive or obstetric history, unspecified trimester: Secondary | ICD-10-CM | POA: Insufficient documentation

## 2013-01-25 DIAGNOSIS — O34219 Maternal care for unspecified type scar from previous cesarean delivery: Secondary | ICD-10-CM | POA: Insufficient documentation

## 2013-01-25 DIAGNOSIS — O358XX0 Maternal care for other (suspected) fetal abnormality and damage, not applicable or unspecified: Secondary | ICD-10-CM | POA: Insufficient documentation

## 2013-01-25 DIAGNOSIS — O289 Unspecified abnormal findings on antenatal screening of mother: Secondary | ICD-10-CM | POA: Insufficient documentation

## 2013-01-25 DIAGNOSIS — O352XX1 Maternal care for (suspected) hereditary disease in fetus, fetus 1: Secondary | ICD-10-CM

## 2013-02-01 ENCOUNTER — Other Ambulatory Visit (HOSPITAL_COMMUNITY): Payer: Self-pay | Admitting: Maternal and Fetal Medicine

## 2013-02-01 ENCOUNTER — Encounter (HOSPITAL_COMMUNITY): Payer: Self-pay | Admitting: *Deleted

## 2013-02-01 ENCOUNTER — Ambulatory Visit (HOSPITAL_COMMUNITY)
Admission: RE | Admit: 2013-02-01 | Discharge: 2013-02-01 | Disposition: A | Discharge status: Home / Self Care | Payer: BC Managed Care – PPO | Source: Ambulatory Visit | Attending: Obstetrics and Gynecology | Admitting: Obstetrics and Gynecology

## 2013-02-01 ENCOUNTER — Inpatient Hospital Stay (HOSPITAL_COMMUNITY): Payer: BC Managed Care – PPO | Admitting: Anesthesiology

## 2013-02-01 ENCOUNTER — Encounter (HOSPITAL_COMMUNITY): Payer: Self-pay | Admitting: Anesthesiology

## 2013-02-01 ENCOUNTER — Inpatient Hospital Stay (HOSPITAL_COMMUNITY)
Admission: AD | Admit: 2013-02-01 | Discharge: 2013-02-03 | DRG: 372 | Disposition: A | Discharge status: Home / Self Care | Payer: BC Managed Care – PPO | Source: Ambulatory Visit | Attending: Obstetrics & Gynecology | Admitting: Obstetrics & Gynecology

## 2013-02-01 DIAGNOSIS — D62 Acute posthemorrhagic anemia: Secondary | ICD-10-CM

## 2013-02-01 DIAGNOSIS — O365990 Maternal care for other known or suspected poor fetal growth, unspecified trimester, not applicable or unspecified: Principal | ICD-10-CM | POA: Diagnosis present

## 2013-02-01 DIAGNOSIS — D649 Anemia, unspecified: Secondary | ICD-10-CM

## 2013-02-01 DIAGNOSIS — O3510X0 Maternal care for (suspected) chromosomal abnormality in fetus, unspecified, not applicable or unspecified: Secondary | ICD-10-CM | POA: Diagnosis present

## 2013-02-01 DIAGNOSIS — O352XX1 Maternal care for (suspected) hereditary disease in fetus, fetus 1: Secondary | ICD-10-CM

## 2013-02-01 DIAGNOSIS — IMO0002 Reserved for concepts with insufficient information to code with codable children: Secondary | ICD-10-CM | POA: Diagnosis present

## 2013-02-01 DIAGNOSIS — O351XX0 Maternal care for (suspected) chromosomal abnormality in fetus, not applicable or unspecified: Secondary | ICD-10-CM | POA: Diagnosis present

## 2013-02-01 DIAGNOSIS — O9903 Anemia complicating the puerperium: Secondary | ICD-10-CM | POA: Diagnosis not present

## 2013-02-01 DIAGNOSIS — O34219 Maternal care for unspecified type scar from previous cesarean delivery: Secondary | ICD-10-CM | POA: Diagnosis present

## 2013-02-01 HISTORY — DX: Acute posthemorrhagic anemia: D62

## 2013-02-01 LAB — RPR: RPR Ser Ql: NONREACTIVE

## 2013-02-01 LAB — CBC
MCH: 23.6 pg — ABNORMAL LOW (ref 26.0–34.0)
MCHC: 30.2 g/dL (ref 30.0–36.0)
Platelets: 291 10*3/uL (ref 150–400)
RBC: 4.16 MIL/uL (ref 3.87–5.11)
RDW: 20.4 % — ABNORMAL HIGH (ref 11.5–15.5)

## 2013-02-01 LAB — TYPE AND SCREEN

## 2013-02-01 MED ORDER — LACTATED RINGERS IV SOLN
500.0000 mL | Freq: Once | INTRAVENOUS | Status: AC
  Administered 2013-02-01: 500 mL via INTRAVENOUS

## 2013-02-01 MED ORDER — FENTANYL 2.5 MCG/ML BUPIVACAINE 1/10 % EPIDURAL INFUSION (WH - ANES)
14.0000 mL/h | INTRAMUSCULAR | Status: DC | PRN
  Filled 2013-02-01: qty 125

## 2013-02-01 MED ORDER — BUPIVACAINE HCL (PF) 0.25 % IJ SOLN
INTRAMUSCULAR | Status: DC | PRN
  Administered 2013-02-01 (×2): 5 mL via EPIDURAL

## 2013-02-01 MED ORDER — EPHEDRINE 5 MG/ML INJ
10.0000 mg | INTRAVENOUS | Status: DC | PRN
  Filled 2013-02-01: qty 2

## 2013-02-01 MED ORDER — CITRIC ACID-SODIUM CITRATE 334-500 MG/5ML PO SOLN
30.0000 mL | ORAL | Status: DC | PRN
  Filled 2013-02-01: qty 15

## 2013-02-01 MED ORDER — FLEET ENEMA 7-19 GM/118ML RE ENEM: 1.0000 | ENEMA | RECTAL | Status: DC | PRN

## 2013-02-01 MED ORDER — ACETAMINOPHEN 325 MG PO TABS: 650.0000 mg | ORAL_TABLET | ORAL | Status: DC | PRN

## 2013-02-01 MED ORDER — ZOLPIDEM TARTRATE 5 MG PO TABS: 5.0000 mg | ORAL_TABLET | Freq: Every evening | ORAL | Status: DC | PRN

## 2013-02-01 MED ORDER — TERBUTALINE SULFATE 1 MG/ML IJ SOLN: 0.2500 mg | Freq: Once | INTRAMUSCULAR | Status: AC | PRN

## 2013-02-01 MED ORDER — IBUPROFEN 600 MG PO TABS
600.0000 mg | ORAL_TABLET | Freq: Four times a day (QID) | ORAL | Status: DC | PRN
  Administered 2013-02-01: 600 mg via ORAL
  Filled 2013-02-01: qty 1

## 2013-02-01 MED ORDER — OXYTOCIN BOLUS FROM INFUSION
500.0000 mL | INTRAVENOUS | Status: DC
  Administered 2013-02-01: 500 mL via INTRAVENOUS

## 2013-02-01 MED ORDER — OXYTOCIN 40 UNITS IN LACTATED RINGERS INFUSION - SIMPLE MED: 62.5000 mL/h | INTRAVENOUS | Status: DC

## 2013-02-01 MED ORDER — FENTANYL 2.5 MCG/ML BUPIVACAINE 1/10 % EPIDURAL INFUSION (WH - ANES)
INTRAMUSCULAR | Status: DC | PRN
  Administered 2013-02-01: 16 mL/h via EPIDURAL

## 2013-02-01 MED ORDER — LACTATED RINGERS IV SOLN
500.0000 mL | INTRAVENOUS | Status: DC | PRN
  Administered 2013-02-01: 500 mL via INTRAVENOUS

## 2013-02-01 MED ORDER — FENTANYL 2.5 MCG/ML BUPIVACAINE 1/10 % EPIDURAL INFUSION (WH - ANES)
16.0000 mL/h | INTRAMUSCULAR | Status: DC | PRN
  Filled 2013-02-01: qty 125

## 2013-02-01 MED ORDER — EPHEDRINE 5 MG/ML INJ
10.0000 mg | INTRAVENOUS | Status: DC | PRN
  Filled 2013-02-01: qty 4
  Filled 2013-02-01: qty 2

## 2013-02-01 MED ORDER — ONDANSETRON HCL 4 MG/2ML IJ SOLN: 4.0000 mg | Freq: Four times a day (QID) | INTRAMUSCULAR | Status: DC | PRN

## 2013-02-01 MED ORDER — LIDOCAINE HCL (PF) 1 % IJ SOLN
30.0000 mL | INTRAMUSCULAR | Status: DC | PRN
  Filled 2013-02-01: qty 30

## 2013-02-01 MED ORDER — OXYTOCIN 40 UNITS IN LACTATED RINGERS INFUSION - SIMPLE MED
1.0000 m[IU]/min | INTRAVENOUS | Status: DC
  Administered 2013-02-01: 2 m[IU]/min via INTRAVENOUS
  Filled 2013-02-01: qty 1000

## 2013-02-01 MED ORDER — LIDOCAINE HCL (PF) 1 % IJ SOLN
INTRAMUSCULAR | Status: DC | PRN
  Administered 2013-02-01: 4 mL
  Administered 2013-02-01: 5 mL

## 2013-02-01 MED ORDER — PHENYLEPHRINE 40 MCG/ML (10ML) SYRINGE FOR IV PUSH (FOR BLOOD PRESSURE SUPPORT)
80.0000 ug | PREFILLED_SYRINGE | INTRAVENOUS | Status: DC | PRN
  Filled 2013-02-01: qty 2

## 2013-02-01 MED ORDER — LACTATED RINGERS IV SOLN
INTRAVENOUS | Status: DC
  Administered 2013-02-01: 17:00:00 via INTRAVENOUS

## 2013-02-01 MED ORDER — PHENYLEPHRINE 40 MCG/ML (10ML) SYRINGE FOR IV PUSH (FOR BLOOD PRESSURE SUPPORT)
80.0000 ug | PREFILLED_SYRINGE | INTRAVENOUS | Status: DC | PRN
  Filled 2013-02-01: qty 2
  Filled 2013-02-01: qty 5

## 2013-02-01 MED ORDER — DIPHENHYDRAMINE HCL 50 MG/ML IJ SOLN: 12.5000 mg | INTRAMUSCULAR | Status: DC | PRN

## 2013-02-01 MED ORDER — OXYCODONE-ACETAMINOPHEN 5-325 MG PO TABS
1.0000 | ORAL_TABLET | ORAL | Status: DC | PRN
  Administered 2013-02-01: 1 via ORAL
  Filled 2013-02-01: qty 1

## 2013-02-01 NOTE — Progress Notes (Signed)
[facility] ultrasound  Indication: 32 yr old G5P4004 at [redacted]w[redacted]d with fetus with susptected trisomy 21 based on cell free fetal DNA screening for follow up fetal growth and BPP.  Findings: 1. Single intrauterine pregnancy. 2. Estimated fetal weight is in the <10th%.  3. Anterior placenta without evidence of previa. 4. Normal amniotic fluid index although slightly decreased for gestational age. 5. The limited anatomy survey is normal. 6. Biophysical profile is 6/8 (-2 for breathing). 7. Elevated systolic/diastolic ratio on umbilical artery Doppler studies. There is persistent forward flow.  Recommendations: 1. Fetal growth restriction with elevated umbilical artery Doppler studies: - given this would recommend delivery at patient is [redacted]w[redacted]d - discussed with fetal growth restriction and abnormal Doppler studies there is an increased risk of fetal intolerance of labor and need for C section but feel trial of labor is appropriate with continuous fetal monitoring 2. Supsected trisomy 21 of cell free fetal DNA: - previously counseled - had normal fetal echocardiogram - confirm diagnosis postnatally  Patient sent to labor and delivery for induction of labor. Discussed with Marlinda Mike who is in agreement with the above recommendations.  Eulis Foster, MD

## 2013-02-01 NOTE — Anesthesia Preprocedure Evaluation (Signed)
Anesthesia Evaluation  Patient identified by MRN, date of birth, ID band Patient awake    Reviewed: Allergy & Precautions, H&P , Patient's Chart, lab work & pertinent test results  Airway Mallampati: III TM Distance: >3 FB Neck ROM: full    Dental no notable dental hx. (+) Teeth Intact   Pulmonary neg pulmonary ROS,  breath sounds clear to auscultation  Pulmonary exam normal       Cardiovascular negative cardio ROS  Rhythm:regular Rate:Normal     Neuro/Psych negative neurological ROS  negative psych ROS   GI/Hepatic negative GI ROS, Neg liver ROS,   Endo/Other  negative endocrine ROS  Renal/GU negative Renal ROS  negative genitourinary   Musculoskeletal   Abdominal Normal abdominal exam  (+)   Peds  Hematology negative hematology ROS (+) anemia ,   Anesthesia Other Findings   Reproductive/Obstetrics (+) Pregnancy Previous C/Section with successful VBAC with last pregnancy This fetus with Down's Syndrome                           Anesthesia Physical Anesthesia Plan  ASA: II  Anesthesia Plan: Epidural   Post-op Pain Management:    Induction:   Airway Management Planned:   Additional Equipment:   Intra-op Plan:   Post-operative Plan:   Informed Consent: I have reviewed the patients History and Physical, chart, labs and discussed the procedure including the risks, benefits and alternatives for the proposed anesthesia with the patient or authorized representative who has indicated his/her understanding and acceptance.     Plan Discussed with: Anesthesiologist  Anesthesia Plan Comments:         Anesthesia Quick Evaluation

## 2013-02-01 NOTE — Anesthesia Procedure Notes (Signed)
Epidural Patient location during procedure: OB Start time: 02/01/2013 4:20 PM  Staffing Anesthesiologist: Kirra Verga A. Performed by: anesthesiologist   Preanesthetic Checklist Completed: patient identified, site marked, surgical consent, pre-op evaluation, timeout performed, IV checked, risks and benefits discussed and monitors and equipment checked  Epidural Patient position: sitting Prep: site prepped and draped and DuraPrep Patient monitoring: continuous pulse ox and blood pressure Approach: midline Injection technique: LOR air  Needle:  Needle type: Tuohy  Needle gauge: 17 G Needle length: 9 cm and 9 Needle insertion depth: 5 cm cm Catheter type: closed end flexible Catheter size: 19 Gauge Catheter at skin depth: 10 cm Test dose: negative and Other  Assessment Events: blood not aspirated, injection not painful, no injection resistance, negative IV test and no paresthesia  Additional Notes Patient identified. Risks and benefits discussed including failed block, incomplete  Pain control, post dural puncture headache, nerve damage, paralysis, blood pressure Changes, nausea, vomiting, reactions to medications-both toxic and allergic and post Partum back pain. All questions were answered. Patient expressed understanding and wished to proceed. Sterile technique was used throughout procedure. Epidural site was Dressed with sterile barrier dressing. No paresthesias, signs of intravascular injection Or signs of intrathecal spread were encountered.  Patient was more comfortable after the epidural was dosed. Please see RN's note for documentation of vital signs and FHR which are stable.

## 2013-02-01 NOTE — Progress Notes (Signed)
Danielle Lambert is a 32 y.o. G5P4004 at [redacted]w[redacted]d by LMP admitted for induction of labor due to Poor fetal growth and abnl UAD for recommendation for delivery by MFM.  Subjective: comfortable  Objective: BP 134/59  Pulse 74  Temp(Src) 98.1 F (36.7 C) (Oral)  Resp 20  Ht 5\' 10"  (1.778 m)  Wt 106.142 kg (234 lb)  BMI 33.58 kg/m2  LMP 05/11/2012      FHT:  FHR: 155 bpm, variability: moderate,  accelerations:  Present,  decelerations:  Absent UC:   none SVE:   2/50/-2  Labs: Lab Results  Component Value Date   WBC 12.8* 01/09/2013   HGB 8.6* 01/09/2013   HCT 28.1* 01/09/2013   MCV 74.9* 01/09/2013   PLT 262 01/09/2013    Assessment / Plan: 38 weeks IUGR with abnl UAD History of C/S with successful VBAC Suspected t21  Labor: Start Pitocin, consider Foley Preeclampsia:  na Fetal Wellbeing:  Category I Pain Control:  Labor support without medications I/D:  n/a Anticipated MOD:  NSVD  Danielle Lambert 02/01/2013, 1:57 PM

## 2013-02-02 ENCOUNTER — Encounter (HOSPITAL_COMMUNITY): Payer: Self-pay | Admitting: Obstetrics and Gynecology

## 2013-02-02 DIAGNOSIS — D62 Acute posthemorrhagic anemia: Secondary | ICD-10-CM

## 2013-02-02 HISTORY — DX: Acute posthemorrhagic anemia: D62

## 2013-02-02 LAB — CBC
MCH: 23.2 pg — ABNORMAL LOW (ref 26.0–34.0)
MCHC: 29.8 g/dL — ABNORMAL LOW (ref 30.0–36.0)
RDW: 20.6 % — ABNORMAL HIGH (ref 11.5–15.5)

## 2013-02-02 MED ORDER — TETANUS-DIPHTH-ACELL PERTUSSIS 5-2.5-18.5 LF-MCG/0.5 IM SUSP
0.5000 mL | Freq: Once | INTRAMUSCULAR | Status: AC
Start: 2013-02-02 — End: 2013-02-02
  Administered 2013-02-02: 0.5 mL via INTRAMUSCULAR
  Filled 2013-02-02: qty 0.5

## 2013-02-02 MED ORDER — METHYLERGONOVINE MALEATE 0.2 MG PO TABS: 0.2000 mg | ORAL_TABLET | ORAL | Status: DC | PRN

## 2013-02-02 MED ORDER — SIMETHICONE 80 MG PO CHEW: 80.0000 mg | CHEWABLE_TABLET | ORAL | Status: DC | PRN

## 2013-02-02 MED ORDER — METHYLERGONOVINE MALEATE 0.2 MG/ML IJ SOLN: 0.2000 mg | INTRAMUSCULAR | Status: DC | PRN

## 2013-02-02 MED ORDER — IBUPROFEN 600 MG PO TABS
600.0000 mg | ORAL_TABLET | Freq: Four times a day (QID) | ORAL | Status: DC
  Administered 2013-02-02 – 2013-02-03 (×5): 600 mg via ORAL
  Filled 2013-02-02 (×6): qty 1

## 2013-02-02 MED ORDER — PRENATAL MULTIVITAMIN CH
1.0000 | ORAL_TABLET | Freq: Every day | ORAL | Status: DC
  Administered 2013-02-02: 1 via ORAL
  Filled 2013-02-02: qty 1

## 2013-02-02 MED ORDER — OXYCODONE-ACETAMINOPHEN 5-325 MG PO TABS
1.0000 | ORAL_TABLET | ORAL | Status: DC | PRN
  Administered 2013-02-02 (×3): 1 via ORAL
  Filled 2013-02-02 (×3): qty 1

## 2013-02-02 MED ORDER — DIPHENHYDRAMINE HCL 25 MG PO CAPS: 25.0000 mg | ORAL_CAPSULE | Freq: Four times a day (QID) | ORAL | Status: DC | PRN

## 2013-02-02 MED ORDER — ONDANSETRON HCL 4 MG/2ML IJ SOLN: 4.0000 mg | INTRAMUSCULAR | Status: DC | PRN

## 2013-02-02 MED ORDER — SENNOSIDES-DOCUSATE SODIUM 8.6-50 MG PO TABS
2.0000 | ORAL_TABLET | Freq: Every day | ORAL | Status: DC
  Administered 2013-02-02: 2 via ORAL

## 2013-02-02 MED ORDER — LANOLIN HYDROUS EX OINT: TOPICAL_OINTMENT | CUTANEOUS | Status: DC | PRN

## 2013-02-02 MED ORDER — DIBUCAINE 1 % RE OINT: 1.0000 "application " | TOPICAL_OINTMENT | RECTAL | Status: DC | PRN

## 2013-02-02 MED ORDER — WITCH HAZEL-GLYCERIN EX PADS: 1.0000 "application " | MEDICATED_PAD | CUTANEOUS | Status: DC | PRN

## 2013-02-02 MED ORDER — ZOLPIDEM TARTRATE 5 MG PO TABS: 5.0000 mg | ORAL_TABLET | Freq: Every evening | ORAL | Status: DC | PRN

## 2013-02-02 MED ORDER — BENZOCAINE-MENTHOL 20-0.5 % EX AERO
1.0000 "application " | INHALATION_SPRAY | CUTANEOUS | Status: DC | PRN
  Administered 2013-02-02: 1 via TOPICAL
  Filled 2013-02-02: qty 56

## 2013-02-02 MED ORDER — ONDANSETRON HCL 4 MG PO TABS: 4.0000 mg | ORAL_TABLET | ORAL | Status: DC | PRN

## 2013-02-02 NOTE — H&P (Signed)
NAME:  Danielle Lambert, Danielle Lambert NO.:  [id number]  MEDICAL RECORD NO.:  [id number]  LOCATION:  9110                          FACILITY:  [facility]  PHYSICIAN:  Lenoard Aden, M.D.DATE OF BIRTH:  October 10, 1981  DATE OF ADMISSION:  02/01/2013 DATE OF DISCHARGE:                             HISTORY & PHYSICAL   CHIEF COMPLAINT:  IUGR, now with abnormal Dopplers for induction recommended by exam.  HISTORY OF PRESENT ILLNESS:  She is a 32 year old white female, G5, P4, history of C-section x1, successful vaginal delivery prior to that C- section with successful VBAC x2, who presents now for induction of labor due to estimated fetal weight less than 10th percentile and abnormal Doppler studies at 38 weeks.  ALLERGIES:  She has no known drug allergies.  MEDICATIONS:  Prenatal vitamins.  SOCIAL HISTORY:  She is a nonsmoker and nondrinker.  She denies domestic or physical violence.  She has a history of C-section x1, history of three previous vaginal deliveries.  She has a personal history of an abnormal Pap smear.  FAMILY HISTORY:  She has a family history of lung cancer and breast cancer.  SURGICAL HISTORY:  Remarkable for C-section and wisdom tooth extraction. Her prenatal course complicated by IUGR, abnormal Dopplers, suspected trisomy 61.  PHYSICAL EXAMINATION:  GENERAL:  She is a well-developed, well- nourished, white female, in no acute distress. HEENT:  Normal. NECK:  Supple.  Full range of motion. LUNGS:  Clear. HEART:  Regular rhythm. ABDOMEN:  Soft, gravid, nontender.  Estimated fetal weight by ultrasound 4 pounds and 12 ounces.  Cervix is 2 cm, 50%, vertex, -2. EXTREMITIES:  There are no cords. NEUROLOGIC:  Nonfocal. SKIN:  Intact.  IMPRESSION: 1. A 38-week intrauterine pregnancy. 2. Suspected trisomy 21. 3. Intrauterine growth restriction with abnormal umbilical artery     Doppler.  PLAN:  Proceed with Pitocin.  Attempts at vaginal delivery,  increased risk of C-section due to IUGR and poor placental reserve, and discussed with the patient.  She acknowledges, wishes to proceed, small risks of Pitocin augmentation, and previous C-section, and very low, but possible risks of uterine dehiscence/rupture discussed.  The patient acknowledges and will proceed.     Lenoard Aden, M.D.     RJT/MEDQ  D:  02/01/2013  T:  02/02/2013  Job:  [id number]

## 2013-02-02 NOTE — Progress Notes (Signed)
Patient ID: Danielle Lambert, female   DOB: 02-17-1981, 32 y.o.   MRN: [id number] PPD # 1    Subjective: Pt reports feeling well/ Pain controlled with ibuprofen and percocet Tolerating po/ Voiding without problems/ No n/v Bleeding is moderate Newborn info:  Information for the patient's newborn:  Lundyn, Coste [[id number]]  female  / circ desired and planned for today per Dr Billy Coast Feeding: breast   Objective:  VS: Blood pressure 126/71, pulse 77, temperature 98.6 F (37 C), temperature source Oral, resp. rate 18.    Recent Labs  02/01/13 1335 02/02/13 0615  WBC 11.4* 13.0*  HGB 9.8* 8.6*  HCT 32.4* 28.9*  PLT 291 236    **(Hgb: 9.2 on 11/24/12)**  Blood type: --/--/A POS (04/14 1335) Rubella: Immune (11/06 0000)    Physical Exam:  General: A & O x 3  alert, cooperative and no distress CV: Regular rate and rhythm Resp: clear Abdomen: soft, nontender, normal bowel sounds Uterine Fundus: firm, below umbilicus, nontender Perineum: intact, mild edema Lochia: moderate Ext: edema trace and Homans sign is negative, no sign of DVT   A/P: PPD # 1/ G5P5005/ S/P: SVD ABL Anemia w/chronic anemia; Fe supplement after 1st BM Infant with Trisomy 21.  Fetal echo and genetic screen for today...stable Doing well Continue routine post partum orders Anticipate D/C home in AM    Demetrius Revel, MSN, [facility] 02/02/2013, 8:53 AM

## 2013-02-02 NOTE — Anesthesia Postprocedure Evaluation (Signed)
  Anesthesia Post-op Note  Patient: Danielle Lambert  Procedure(s) Performed: * No procedures listed *  Patient Location: PACU and Mother/Baby  Anesthesia Type:Epidural  Level of Consciousness: awake, alert  and oriented  Airway and Oxygen Therapy: Patient Spontanous Breathing  Post-op Pain: none  Post-op Assessment: Post-op Vital signs reviewed, Patient's Cardiovascular Status Stable, No headache, No backache, No residual numbness and No residual motor weakness  Post-op Vital Signs: Reviewed and stable  Complications: No apparent anesthesia complications

## 2013-02-03 MED ORDER — FERRALET 90 90-1 MG PO TABS: 1.0000 | ORAL_TABLET | Freq: Two times a day (BID) | ORAL | Status: DC

## 2013-02-03 MED ORDER — IBUPROFEN 600 MG PO TABS: 600.0000 mg | ORAL_TABLET | Freq: Four times a day (QID) | ORAL | Status: DC

## 2013-02-03 NOTE — Progress Notes (Addendum)
Patient ID: Danielle Lambert, female   DOB: June 15, 1981, 31 y.o.   MRN: [id number] PPD # 2  Subjective: Pt reports feeling well and eager for d/c home/ Pain controlled with ibuprofen Tolerating po/ Voiding without problems/ No n/v Bleeding is light/ Newborn info:  Information for the patient's newborn:  Danielle Lambert, Danielle Lambert [[id number]]  female Trisomy work up pending for type.  Cardiac w/u reveals small VSD.  No intervention planned; cards to monitor.  / circ complete/ Feeding: breast    Objective:  VS: Blood pressure 127/81, pulse 64, temperature 97.3 F (36.3 C), temperature source Oral, resp. rate 18.    Recent Labs  02/01/13 1335 02/02/13 0615  WBC 11.4* 13.0*  HGB 9.8* 8.6*  HCT 32.4* 28.9*  PLT 291 236    Blood type: --/--/A POS (04/14 1335) Rubella: Immune (11/06 0000)    Physical Exam:  General: A & O x 3  alert, cooperative and no distress CV: Regular rate and rhythm Resp: clear Abdomen: soft, nontender, normal bowel sounds Uterine Fundus: firm, below umbilicus, nontender Perineum: intact; no edema Lochia: minimal Ext: Homans sign is negative, no sign of DVT and no edema, redness or tenderness in the calves or thighs    A/P: PPD # 2/ G5P5005/ S/P: SVD Chronic anemia, superimposed with ABL Anemia Doing well and stable for discharge home RX: Ibuprofen 600mg  po Q 6 hrs prn pain #30 Refill x 1 Ferralet 90 1 tab po BID Disp #60, Refill x 2 WOB/GYN booklet given Routine pp visit in 6wks   Demetrius Revel, MSN, [facility] 02/03/2013, 9:28 AM

## 2013-02-03 NOTE — Discharge Summary (Signed)
Obstetric Discharge Summary Reason for Admission:   G5  P4 0 0 4 @ 38 wks with IUGR, now with abnormal Dopplers for induction, suspected trisomy 21 Prenatal Procedures: NST, ultrasound and level 2 u/s Intrapartum Procedures: spontaneous vaginal delivery Postpartum Procedures: none Complications-Operative and Postpartum: none Hemoglobin  Date Value Range Status  02/02/2013 8.6* 12.0 - 15.0 g/dL Final     HCT  Date Value Range Status  02/02/2013 28.9* 36.0 - 46.0 % Final    Physical Exam:  General: alert, cooperative and no distress Lochia: appropriate Uterine Fundus: firm Incision: n/a DVT Evaluation: No evidence of DVT seen on physical exam. Negative Homan's sign.  Discharge Diagnoses: G5 P5 S/P SVD @ 38wks with IUGR Infant with Trisomy.  Typing for trisomy pending.  Small VSD per cardiology w/u; cardiology to follow; no intervention planned  Discharge Information: Date: 02/03/2013 Activity: pelvic rest Diet: routine Medications: PNV, Ibuprofen, Colace and Iron Condition: stable Instructions: refer to practice specific booklet Discharge to: home Follow-up Information   Follow up with COUSINS,SHERONETTE A, MD In 6 weeks.   Contact information:   [address] [place] [address] [phone]       Newborn Data: Live born female on 03/03/13 Francis Dowse) Trisomy w/u pending; small VSD, no intervention planned Birth Weight: 5 lb 8.2 oz (2500 g) APGAR: 7, 8  Home with mother.  Daevon Holdren K 02/03/2013, 9:32 AM

## 2013-05-14 ENCOUNTER — Other Ambulatory Visit: Payer: Self-pay

## 2013-05-14 DIAGNOSIS — Z1231 Encounter for screening mammogram for malignant neoplasm of breast: Secondary | ICD-10-CM

## 2013-06-23 ENCOUNTER — Ambulatory Visit
Admission: RE | Admit: 2013-06-23 | Discharge: 2013-06-23 | Disposition: A | Discharge status: Home / Self Care | Payer: Self-pay | Source: Ambulatory Visit

## 2013-06-23 DIAGNOSIS — Z1231 Encounter for screening mammogram for malignant neoplasm of breast: Secondary | ICD-10-CM

## 2014-07-22 IMAGING — US US OB FOLLOW-UP
1 series · 16 of 28 positions shown · non-contrast
Comparison: none

[Series 1: us ob follow-up · 0.23mm/px · 16 of 73 slices shown]
[im 1/73]
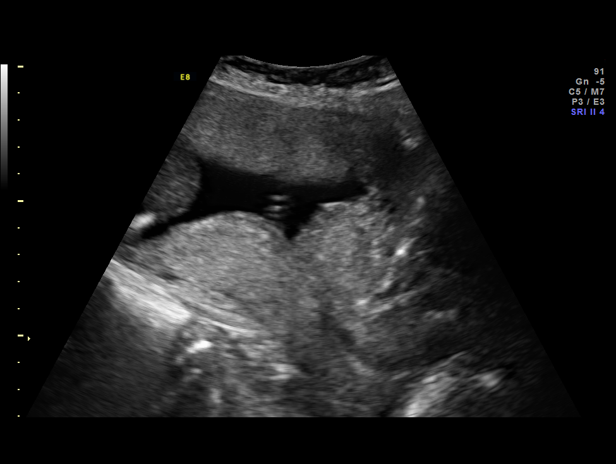
[im 6/73]
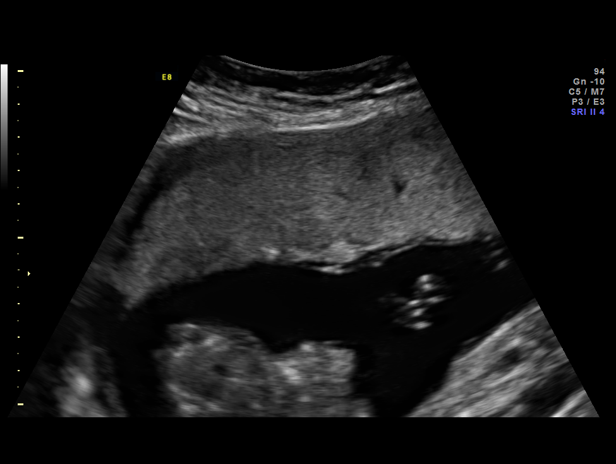
[im 11/73]
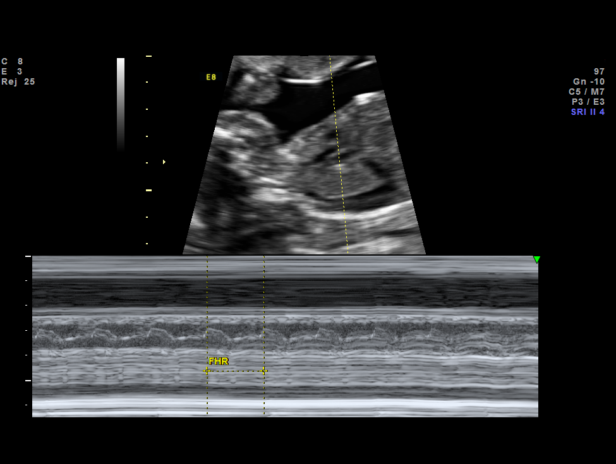
[im 17/73]
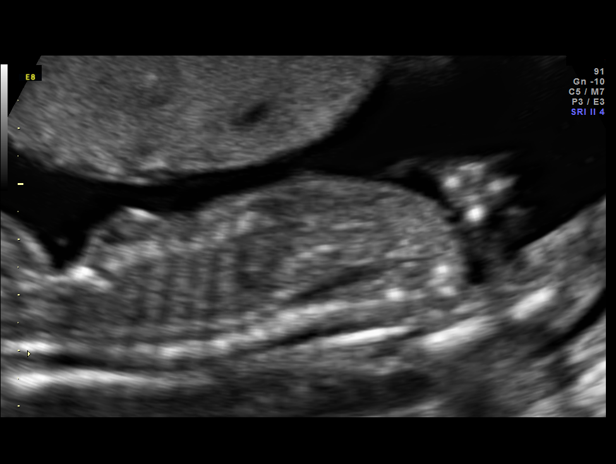
[im 19/73]
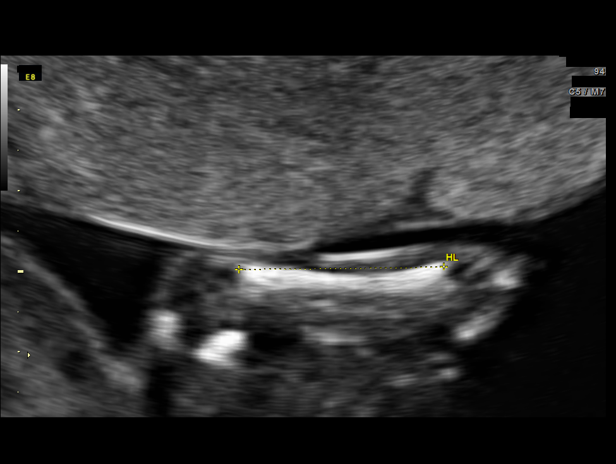
[im 25/73]
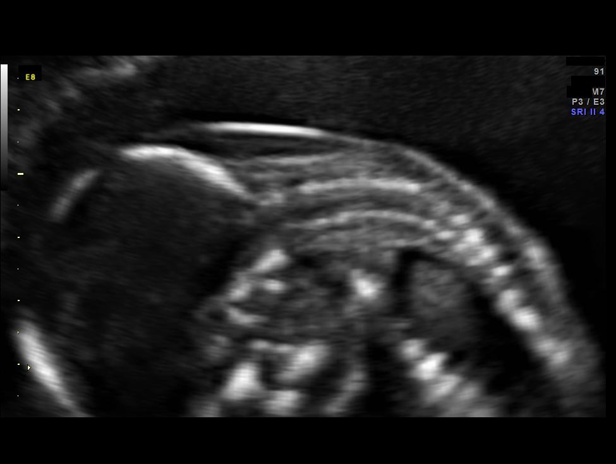
[im 30/73]
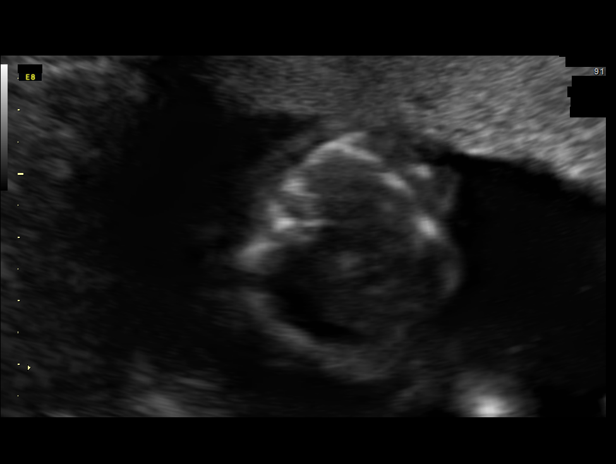
[im 35/73]
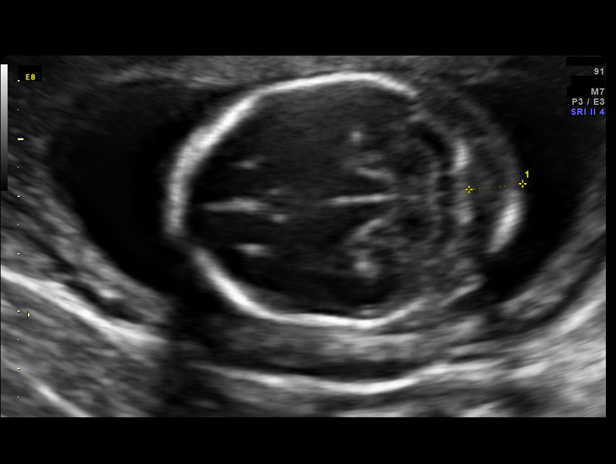
[im 38/73]
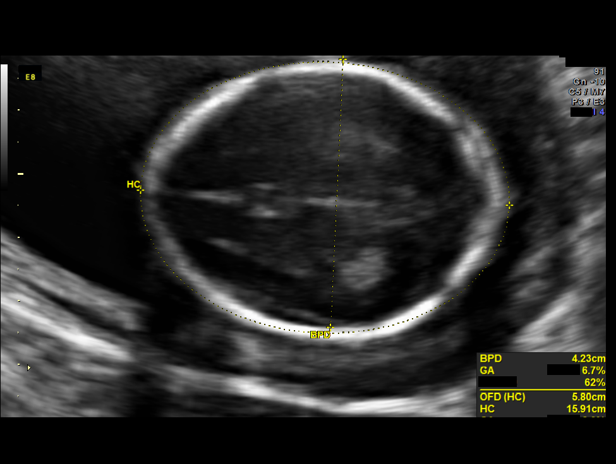
[im 43/73]
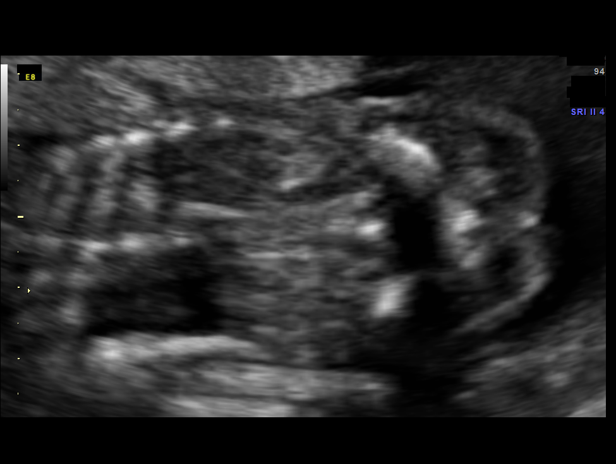
[im 49/73]
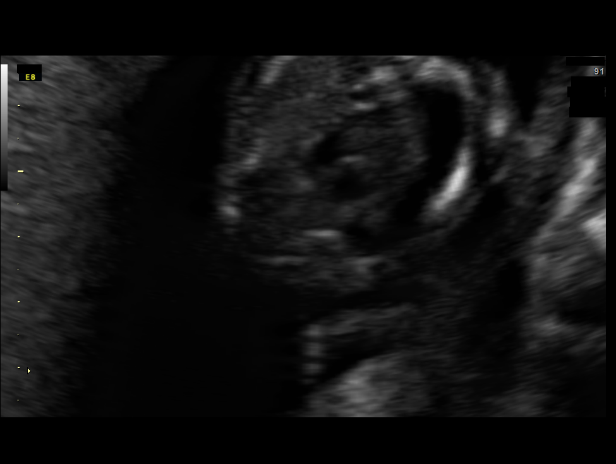
[im 54/73]
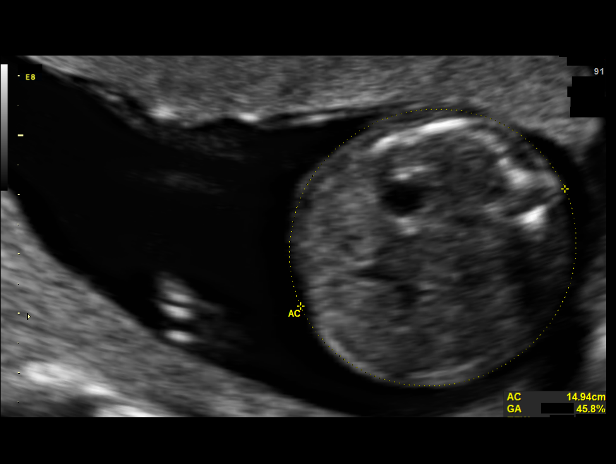
[im 57/73]
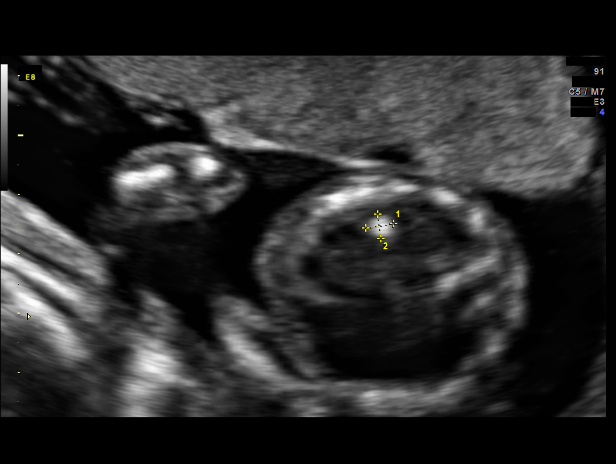
[im 62/73]
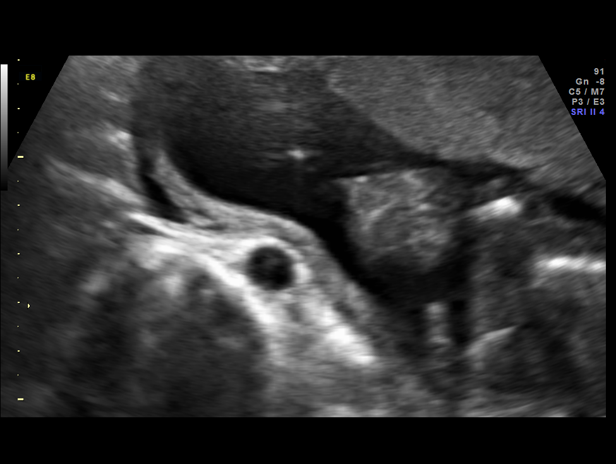
[im 67/73]
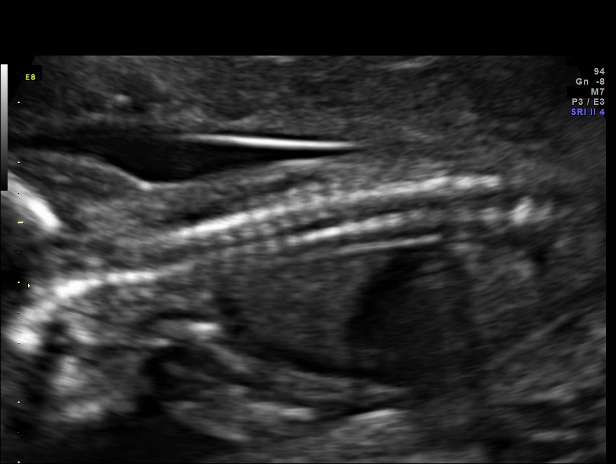
[im 73/73]
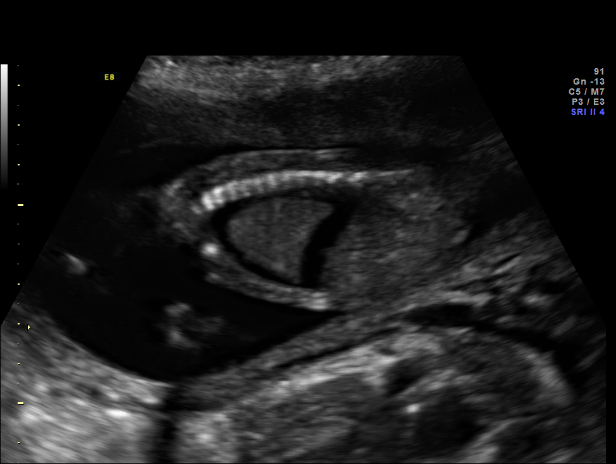

[16 of 28 positions shown; findings below may reference images not displayed]

OBSTETRICS REPORT
                      (Signed Final 09/29/2012 [DATE])

Service(s) Provided

 US OB FOLLOW UP                                       76816.1
Indications

 Cystic hygroma
 Echogenic intracardiac focus of the heart  (EIF)
 Poor obstetric history: Previous gestational
 diabetes
 Positive [name] T21
Fetal Evaluation

 Num Of Fetuses:    1
 Fetal Heart Rate:  150                          bpm
 Cardiac Activity:  Observed
 Presentation:      Breech
 Placenta:          Anterior, above cervical os
 P. Cord            Previously Visualized
 Insertion:

 Amniotic Fluid
 AFI FV:      Subjectively within normal limits
                                             Larg Pckt:     5.3  cm
Biometry

 BPD:     42.7  mm     G. Age:  18w 6d                CI:         73.6   70 - 86
 OFD:       58  mm                                    FL/HC:      16.0   16.8 -

 HC:     160.5  mm     G. Age:  18w 6d        4  %    HC/AC:      1.07   1.09 -

 AC:     149.4  mm     G. Age:  20w 1d       46  %    FL/BPD:
 FL:      25.6  mm     G. Age:  17w 5d      < 3  %    FL/AC:      17.1   20 - 24
 HUM:     26.4  mm     G. Age:  18w 2d      < 5  %

 Est. FW:     271  gm    0 lb 10 oz      27  %
Gestational Age

 LMP:           20w 1d        Date:  05/11/12                 EDD:   02/15/13
 U/S Today:     18w 6d                                        EDD:   02/24/13
 Best:          20w 1d     Det. By:  LMP  (05/11/12)          EDD:   02/15/13
Anatomy

 Cranium:          Appears normal         Aortic Arch:      Appears normal
 Fetal Cavum:      Appears normal         Ductal Arch:      Not well visualized
 Ventricles:       Appears normal         Diaphragm:        Appears normal
 Choroid Plexus:   Appears normal         Stomach:          Appears normal, left
                                                            sided
 Cerebellum:       Appears normal         Abdomen:          4mm Calcification
 Posterior Fossa:  Appears normal         Abdominal Wall:   Appears nml (cord
                                                            insert, abd wall)
 Nuchal Fold:      Appears enlarged,      Cord Vessels:     Appears normal (3
                   8.4 mm
                                                            vessel cord)
 Face:             Orbits and profile     Kidneys:          Appear normal
                   previously seen
 Lips:             Appears normal         Bladder:          Appears normal
 Heart:            Not well visualized    Spine:            Appears normal
 RVOT:             Not well visualized    Lower             Previously seen
                                          Extremities:
 LVOT:             Not well visualized    Upper             Previously seen
                                          Extremities:

 Other:  Male gender. Heels and 5th digit previously seen.  Pleural effusions
         noted (see comments).
Cervix Uterus Adnexa

 Cervix:       Normal appearance by transabdominal scan. Appears
               closed, without funnelling.
Comments

 Ms. Talking fetus is suspected to have Downs syndrome
 based on cell-free fetal DNA testing results.  The fetus also
 has several ultrasound features consistent with that diagnosis
 such as a enlarged nuchal fold, short femur, and short
 humerus.  In addition, a right sided pleural effusion was seen
 on today's ultrasound.  It appears to be equal or slightly
 larger in size that seen on the fetal echo on 09/23/12.  As the
 fetal echo demonstrated normal cardiac anatomy, the pleural
 effusion is likely due to the lymphatic abnormalities typically
 associated with Downs Syndrome rather than a harbinger of
 heart failure.  We discussed that should the pleural effusion
 worsen or fluid accumulations develop in other body cavities
 this could results in fetal death and that nothing could be
 done given her previable gestational age.  We will reassess
 the pleural effusion in 2 weeks at which time Ms. Talking
 fetus will be close to viability.  We dicussed that although her
 fetus will be technically viable at 23 weeks of gestation, the
 prognosis for neonatal survival is guarded at that gestational
 age particularly considering that her fetus has Downs
 syndrome and has developed a pleural effusion .
 Furthermore, should delivery be needed around this time a
 classical cesarean section would likely be required which
 would impact her future childbearing.  In short there is much
 to consider in Ms. Talking case.  Plans for ongoing
 management of her pregnancy will be reassessed when she
 returns in two weeks using the information gained on that
 ultrasound as a guide for further decision making.
Impression

 Single living intrauterine pregnancy at 20 weeks 1 day.
 Appropriate interval fetal growth (27%).
 Cell-free fetal DNA suggesting Downs Syndrome.
 Short femur and humerus.
 Prior cystic hygroma.
 Normal amniotic fluid volume.
 Right sided pleural effusion.
Recommendations

 Recommend follow-up ultrasound examination in 2 weeks to
 reassess pleural effusion.

 questions or concerns.
                Fedeline, Varriola

## 2014-08-22 ENCOUNTER — Encounter (HOSPITAL_COMMUNITY): Payer: Self-pay | Admitting: Obstetrics and Gynecology

## 2014-10-21 NOTE — L&D Delivery Note (Signed)
Delivery Note At 6:45 PM a viable and healthy female was delivered via VBAC, Spontaneous (Presentation: Occiput Anterior).  APGAR: 8, 9; weight 8 lb 11.2 oz (3946 g).   Placenta status: Intact, Spontaneous.  Cord: 3 vessels with the following complications: None.  Cord pH none  Anesthesia: Epidural  Episiotomy: None Lacerations: None Suture Repair: 3.0 none Est. Blood Loss (mL): 300  Mom to postpartum.  Baby to Couplet care / Skin to Skin.  Jihaad Bruschi A 05/24/2015, 9:14 PM

## 2014-11-23 LAB — OB RESULTS CONSOLE GC/CHLAMYDIA
CHLAMYDIA, DNA PROBE: NEGATIVE
GC PROBE AMP, GENITAL: NEGATIVE

## 2014-11-23 LAB — OB RESULTS CONSOLE HIV ANTIBODY (ROUTINE TESTING): HIV: NONREACTIVE

## 2014-12-07 LAB — OB RESULTS CONSOLE RPR: RPR: NONREACTIVE

## 2015-04-28 LAB — OB RESULTS CONSOLE GBS: STREP GROUP B AG: NEGATIVE

## 2015-05-24 ENCOUNTER — Inpatient Hospital Stay (HOSPITAL_COMMUNITY)
Admission: AD | Admit: 2015-05-24 | Discharge: 2015-05-26 | DRG: 775 | Disposition: A | Discharge status: Home / Self Care | Payer: BLUE CROSS/BLUE SHIELD | Source: Ambulatory Visit | Attending: Obstetrics and Gynecology | Admitting: Obstetrics and Gynecology

## 2015-05-24 ENCOUNTER — Inpatient Hospital Stay (HOSPITAL_COMMUNITY): Payer: BLUE CROSS/BLUE SHIELD | Admitting: Anesthesiology

## 2015-05-24 ENCOUNTER — Encounter (HOSPITAL_COMMUNITY): Payer: Self-pay | Admitting: *Deleted

## 2015-05-24 DIAGNOSIS — O34219 Maternal care for unspecified type scar from previous cesarean delivery: Secondary | ICD-10-CM | POA: Diagnosis present

## 2015-05-24 DIAGNOSIS — O3421 Maternal care for scar from previous cesarean delivery: Principal | ICD-10-CM | POA: Diagnosis present

## 2015-05-24 DIAGNOSIS — O2243 Hemorrhoids in pregnancy, third trimester: Secondary | ICD-10-CM | POA: Diagnosis present

## 2015-05-24 DIAGNOSIS — O09523 Supervision of elderly multigravida, third trimester: Secondary | ICD-10-CM

## 2015-05-24 DIAGNOSIS — O0943 Supervision of pregnancy with grand multiparity, third trimester: Secondary | ICD-10-CM

## 2015-05-24 DIAGNOSIS — O0940 Supervision of pregnancy with grand multiparity, unspecified trimester: Secondary | ICD-10-CM

## 2015-05-24 DIAGNOSIS — Z3A39 39 weeks gestation of pregnancy: Secondary | ICD-10-CM | POA: Diagnosis present

## 2015-05-24 DIAGNOSIS — Z3490 Encounter for supervision of normal pregnancy, unspecified, unspecified trimester: Secondary | ICD-10-CM

## 2015-05-24 HISTORY — DX: Gestational diabetes mellitus in pregnancy, unspecified control: O24.419

## 2015-05-24 HISTORY — DX: Family history of other congenital malformations, deformations and chromosomal abnormalities: Z82.79

## 2015-05-24 HISTORY — DX: Unspecified hemorrhoids: K64.9

## 2015-05-24 HISTORY — DX: Anogenital (venereal) warts: A63.0

## 2015-05-24 HISTORY — DX: History of uterine scar from previous surgery: Z98.891

## 2015-05-24 HISTORY — DX: Reserved for concepts with insufficient information to code with codable children: IMO0002

## 2015-05-24 HISTORY — DX: Partial loss of teeth, unspecified cause, unspecified class: K08.409

## 2015-05-24 LAB — CBC
HCT: 28.9 % — ABNORMAL LOW (ref 36.0–46.0)
HEMOGLOBIN: 8.6 g/dL — AB (ref 12.0–15.0)
MCH: 24 pg — AB (ref 26.0–34.0)
MCHC: 29.8 g/dL — AB (ref 30.0–36.0)
MCV: 80.5 fL (ref 78.0–100.0)
Platelets: 278 10*3/uL (ref 150–400)
RBC: 3.59 MIL/uL — ABNORMAL LOW (ref 3.87–5.11)
RDW: 17.6 % — ABNORMAL HIGH (ref 11.5–15.5)
WBC: 11.9 10*3/uL — AB (ref 4.0–10.5)

## 2015-05-24 LAB — TYPE AND SCREEN
ABO/RH(D): A POS
Antibody Screen: NEGATIVE

## 2015-05-24 MED ORDER — ZOLPIDEM TARTRATE 5 MG PO TABS: 5.0000 mg | ORAL_TABLET | Freq: Every evening | ORAL | Status: DC | PRN

## 2015-05-24 MED ORDER — DIBUCAINE 1 % RE OINT
1.0000 "application " | TOPICAL_OINTMENT | RECTAL | Status: DC | PRN
  Administered 2015-05-25: 1 via RECTAL
  Filled 2015-05-24: qty 28

## 2015-05-24 MED ORDER — PHENYLEPHRINE 40 MCG/ML (10ML) SYRINGE FOR IV PUSH (FOR BLOOD PRESSURE SUPPORT)
80.0000 ug | PREFILLED_SYRINGE | INTRAVENOUS | Status: DC | PRN
  Filled 2015-05-24: qty 20
  Filled 2015-05-24: qty 2

## 2015-05-24 MED ORDER — LANOLIN HYDROUS EX OINT: TOPICAL_OINTMENT | CUTANEOUS | Status: DC | PRN

## 2015-05-24 MED ORDER — CITRIC ACID-SODIUM CITRATE 334-500 MG/5ML PO SOLN: 30.0000 mL | ORAL | Status: DC | PRN

## 2015-05-24 MED ORDER — OXYTOCIN 40 UNITS IN LACTATED RINGERS INFUSION - SIMPLE MED: 62.5000 mL/h | INTRAVENOUS | Status: DC

## 2015-05-24 MED ORDER — FLEET ENEMA 7-19 GM/118ML RE ENEM: 1.0000 | ENEMA | RECTAL | Status: DC | PRN

## 2015-05-24 MED ORDER — ONDANSETRON HCL 4 MG/2ML IJ SOLN
4.0000 mg | Freq: Four times a day (QID) | INTRAMUSCULAR | Status: DC | PRN
  Filled 2015-05-24: qty 2

## 2015-05-24 MED ORDER — EPHEDRINE 5 MG/ML INJ
10.0000 mg | INTRAVENOUS | Status: DC | PRN
  Filled 2015-05-24: qty 2

## 2015-05-24 MED ORDER — LACTATED RINGERS IV SOLN
INTRAVENOUS | Status: DC
  Administered 2015-05-24 (×3): via INTRAVENOUS

## 2015-05-24 MED ORDER — FENTANYL 2.5 MCG/ML BUPIVACAINE 1/10 % EPIDURAL INFUSION (WH - ANES)
14.0000 mL/h | INTRAMUSCULAR | Status: DC | PRN
  Administered 2015-05-24 (×3): 14 mL/h via EPIDURAL
  Filled 2015-05-24 (×2): qty 125

## 2015-05-24 MED ORDER — OXYTOCIN 40 UNITS IN LACTATED RINGERS INFUSION - SIMPLE MED
1.0000 m[IU]/min | INTRAVENOUS | Status: DC
Start: 2015-05-24 — End: 2015-05-24
  Administered 2015-05-24: 2 m[IU]/min via INTRAVENOUS
  Filled 2015-05-24: qty 1000

## 2015-05-24 MED ORDER — FERROUS SULFATE 325 (65 FE) MG PO TABS
325.0000 mg | ORAL_TABLET | Freq: Two times a day (BID) | ORAL | Status: DC
  Administered 2015-05-25 – 2015-05-26 (×3): 325 mg via ORAL
  Filled 2015-05-24 (×3): qty 1

## 2015-05-24 MED ORDER — DIPHENHYDRAMINE HCL 50 MG/ML IJ SOLN
12.5000 mg | INTRAMUSCULAR | Status: DC | PRN
Start: 2015-05-24 — End: 2015-05-24

## 2015-05-24 MED ORDER — PRENATAL MULTIVITAMIN CH
1.0000 | ORAL_TABLET | Freq: Every day | ORAL | Status: DC
  Administered 2015-05-25: 1 via ORAL
  Filled 2015-05-24: qty 1

## 2015-05-24 MED ORDER — ACETAMINOPHEN 325 MG PO TABS: 650.0000 mg | ORAL_TABLET | ORAL | Status: DC | PRN

## 2015-05-24 MED ORDER — OXYCODONE-ACETAMINOPHEN 5-325 MG PO TABS
2.0000 | ORAL_TABLET | ORAL | Status: DC | PRN
Start: 2015-05-24 — End: 2015-05-24

## 2015-05-24 MED ORDER — ONDANSETRON HCL 4 MG PO TABS: 4.0000 mg | ORAL_TABLET | ORAL | Status: DC | PRN

## 2015-05-24 MED ORDER — LACTATED RINGERS IV SOLN
500.0000 mL | INTRAVENOUS | Status: DC | PRN
  Administered 2015-05-24: 500 mL via INTRAVENOUS

## 2015-05-24 MED ORDER — SENNOSIDES-DOCUSATE SODIUM 8.6-50 MG PO TABS
2.0000 | ORAL_TABLET | ORAL | Status: DC
  Administered 2015-05-24: 2 via ORAL
  Filled 2015-05-24: qty 2

## 2015-05-24 MED ORDER — LIDOCAINE HCL (PF) 1 % IJ SOLN
30.0000 mL | INTRAMUSCULAR | Status: DC | PRN
  Filled 2015-05-24: qty 30

## 2015-05-24 MED ORDER — DIPHENHYDRAMINE HCL 25 MG PO CAPS: 25.0000 mg | ORAL_CAPSULE | Freq: Four times a day (QID) | ORAL | Status: DC | PRN

## 2015-05-24 MED ORDER — OXYCODONE-ACETAMINOPHEN 5-325 MG PO TABS: 1.0000 | ORAL_TABLET | ORAL | Status: DC | PRN

## 2015-05-24 MED ORDER — OXYCODONE-ACETAMINOPHEN 5-325 MG PO TABS
2.0000 | ORAL_TABLET | ORAL | Status: DC | PRN
Start: 2015-05-24 — End: 2015-05-26
  Administered 2015-05-25: 2 via ORAL
  Filled 2015-05-24 (×2): qty 2

## 2015-05-24 MED ORDER — OXYCODONE-ACETAMINOPHEN 5-325 MG PO TABS
1.0000 | ORAL_TABLET | ORAL | Status: DC | PRN
  Administered 2015-05-24 – 2015-05-25 (×3): 1 via ORAL
  Filled 2015-05-24 (×3): qty 1

## 2015-05-24 MED ORDER — IBUPROFEN 600 MG PO TABS
600.0000 mg | ORAL_TABLET | Freq: Four times a day (QID) | ORAL | Status: DC
  Administered 2015-05-24 – 2015-05-26 (×6): 600 mg via ORAL
  Filled 2015-05-24 (×7): qty 1

## 2015-05-24 MED ORDER — BENZOCAINE-MENTHOL 20-0.5 % EX AERO
1.0000 "application " | INHALATION_SPRAY | CUTANEOUS | Status: DC | PRN
  Filled 2015-05-24: qty 56

## 2015-05-24 MED ORDER — LIDOCAINE HCL (PF) 1 % IJ SOLN
INTRAMUSCULAR | Status: DC | PRN
  Administered 2015-05-24: 5 mL via EPIDURAL
  Administered 2015-05-24: 5 mL

## 2015-05-24 MED ORDER — SIMETHICONE 80 MG PO CHEW: 80.0000 mg | CHEWABLE_TABLET | ORAL | Status: DC | PRN

## 2015-05-24 MED ORDER — WITCH HAZEL-GLYCERIN EX PADS: 1.0000 "application " | MEDICATED_PAD | CUTANEOUS | Status: DC | PRN

## 2015-05-24 MED ORDER — ONDANSETRON HCL 4 MG/2ML IJ SOLN: 4.0000 mg | INTRAMUSCULAR | Status: DC | PRN

## 2015-05-24 MED ORDER — TERBUTALINE SULFATE 1 MG/ML IJ SOLN
0.2500 mg | Freq: Once | INTRAMUSCULAR | Status: DC | PRN
  Filled 2015-05-24: qty 1

## 2015-05-24 MED ORDER — OXYTOCIN BOLUS FROM INFUSION: 500.0000 mL | INTRAVENOUS | Status: DC

## 2015-05-24 NOTE — H&P (Signed)
Danielle Lambert is a 34 y.o. female presenting @ term  for IOL 2nd to favorable cervix and grand multiparity. (+) irreg ctx. GBS cx neg. Hx LTCS with prior successful VBAC  Maternal Medical History:  Contractions: Onset was more than 2 days ago.   Frequency: irregular.    Fetal activity: Perceived fetal activity is normal.    Prenatal complications: no prenatal complications Prenatal Complications - Diabetes: none.    OB History    Gravida Para Term Preterm AB TAB SAB Ectopic Multiple Living   6 5 5       5      Past Medical History  Diagnosis Date  . No pertinent past medical history   . Medical history non-contributory   . Acute blood loss anemia 02/02/2013  . Condylomata acuminata in female   . LGSIL (low grade squamous intraepithelial dysplasia)   . Gestational diabetes    Past Surgical History  Procedure Laterality Date  . Cesarean section    . Cesarean section  11/15/2011    Procedure: CESAREAN SECTION;  Surgeon: Marvene Staff, MD;  Location: [facility];  Service: Gynecology;  Laterality: N/A;  . Wisdom tooth extraction     Family History: family history is not on file. Social History:  reports that she has never smoked. She has never used smokeless tobacco. She reports that she does not drink alcohol or use illicit drugs.   Prenatal Transfer Tool  Maternal Diabetes: No Genetic Screening: Declined Maternal Ultrasounds/Referrals: Normal Fetal Ultrasounds or other Referrals:  None Maternal Substance Abuse:  No Significant Maternal Medications:  None Significant Maternal Lab Results:  Lab values include: Group B Strep negative Other Comments:  previous child with DS  ROS neg  Dilation: 4 Effacement (%): 50 Station: -3 Exam by:: Sharyn Lull, RN  Blood pressure 144/56, pulse 83, temperature 98.6 F (37 C), temperature source Oral, resp. rate 18, height 5\' 6"  (1.676 m), weight 107.502 kg (237 lb), unknown if currently breastfeeding. Exam Physical Exam   Constitutional: She is oriented to person, place, and time. She appears well-developed and well-nourished.  HENT:  Head: Atraumatic.  Eyes: EOM are normal.  Cardiovascular: Normal rate and regular rhythm.   GI: Soft.  Musculoskeletal: She exhibits edema.  Neurological: She is alert and oriented to person, place, and time.  Skin: Skin is warm and dry.    Prenatal labs: ABO, Rh: --/--/A POS (08/03 0957) Antibody: PENDING (08/03 0957) Rubella:  Immune RPR: Nonreactive (02/17 0000)  HBsAg:   neg HIV: Non-reactive (02/03 0000)  GBS: Negative (07/08 0000)   Assessment/Plan: Term gestation  grand multiparity Previous C/s P) admit pitocin routine labs amniotomy prn   Jamison Yuhasz A 05/24/2015, 11:20 AM

## 2015-05-24 NOTE — Anesthesia Procedure Notes (Signed)
Epidural Patient location during procedure: OB  Staffing Anesthesiologist: Catalina Gravel Performed by: anesthesiologist   Preanesthetic Checklist Completed: patient identified, pre-op evaluation, timeout performed, IV checked, risks and benefits discussed and monitors and equipment checked  Epidural Patient position: sitting Prep: DuraPrep Patient monitoring: blood pressure and continuous pulse ox Approach: midline Location: L3-L4 Injection technique: LOR air  Needle:  Needle type: Tuohy  Needle gauge: 17 G Needle length: 9 cm Needle insertion depth: 5 cm Catheter size: 19 Gauge Catheter at skin depth: 10 cm Test dose: negative and Other (1% Lidocaine)  Additional Notes Patient identified.  Risk benefits discussed including failed block, incomplete pain control, headache, nerve damage, paralysis, blood pressure changes, nausea, vomiting, reactions to medication both toxic or allergic, and postpartum back pain.  Patient expressed understanding and wished to proceed.  All questions were answered.  Sterile technique used throughout procedure and epidural site dressed with sterile barrier dressing. No paresthesia or other complications noted. The patient did not experience any signs of intravascular injection such as tinnitus or metallic taste in mouth nor signs of intrathecal spread such as rapid motor block. Please see nursing notes for vital signs.

## 2015-05-24 NOTE — Progress Notes (Signed)
Danielle Lambert is a 34 y.o. G6P5005 at [redacted]w[redacted]d by ultrasound admitted for induction of labor due to  Favorable cervix at term. Prev C/s with prior successful VBAC.  Subjective: No chief complaint on file. awaiting epidural Pitocin 6 miu Objective: BP 130/64 mmHg  Pulse 79  Temp(Src) 98.1 F (36.7 C) (Oral)  Resp 18  Ht 5\' 6"  (1.676 m)  Wt 107.502 kg (237 lb)  BMI 38.27 kg/m2  SpO2 100%      FHT:  FHR: 135 bpm, variability: moderate,  accelerations:  Present,  decelerations:  Absent UC:   regular, every 1-3 minutes SVE:    4/60/-3 Tracing: cat 1  Labs: Lab Results  Component Value Date   WBC 11.9* 05/24/2015   HGB 8.6* 05/24/2015   HCT 28.9* 05/24/2015   MCV 80.5 05/24/2015   PLT 278 05/24/2015    Assessment / Plan: Induction of labor due to grandmultiparity, favorable cervix,  progressing well on pitocin   Anticipated MOD:  NSVD  Rainey Kahrs A 05/24/2015, 2:07 PM

## 2015-05-24 NOTE — Anesthesia Preprocedure Evaluation (Signed)
Anesthesia Evaluation  Patient identified by MRN, date of birth, ID band  Reviewed: Allergy & Precautions, H&P , Patient's Chart, lab work & pertinent test results  Airway Mallampati: II   Neck ROM: full    Dental no notable dental hx.    Pulmonary neg pulmonary ROS,  breath sounds clear to auscultation  Pulmonary exam normal       Cardiovascular negative cardio ROS Normal cardiovascular examRhythm:regular Rate:Normal     Neuro/Psych negative neurological ROS     GI/Hepatic   Endo/Other  diabetes, Gestational  Renal/GU      Musculoskeletal   Abdominal   Peds  Hematology  (+) Blood dyscrasia, anemia ,   Anesthesia Other Findings   Reproductive/Obstetrics (+) Pregnancy                             Anesthesia Physical Anesthesia Plan  ASA: II  Anesthesia Plan: Epidural   Post-op Pain Management:    Induction:   Airway Management Planned:   Additional Equipment:   Intra-op Plan:   Post-operative Plan:   Informed Consent: I have reviewed the patients History and Physical, chart, labs and discussed the procedure including the risks, benefits and alternatives for the proposed anesthesia with the patient or authorized representative who has indicated his/her understanding and acceptance.   Dental Advisory Given  Plan Discussed with: Anesthesiologist  Anesthesia Plan Comments:         Anesthesia Quick Evaluation

## 2015-05-25 ENCOUNTER — Encounter (HOSPITAL_COMMUNITY): Payer: Self-pay | Admitting: *Deleted

## 2015-05-25 LAB — CBC
HEMATOCRIT: 27.3 % — AB (ref 36.0–46.0)
Hemoglobin: 8.3 g/dL — ABNORMAL LOW (ref 12.0–15.0)
MCH: 24.1 pg — AB (ref 26.0–34.0)
MCHC: 30.4 g/dL (ref 30.0–36.0)
MCV: 79.4 fL (ref 78.0–100.0)
Platelets: 214 10*3/uL (ref 150–400)
RBC: 3.44 MIL/uL — ABNORMAL LOW (ref 3.87–5.11)
RDW: 17.6 % — ABNORMAL HIGH (ref 11.5–15.5)
WBC: 12.8 10*3/uL — ABNORMAL HIGH (ref 4.0–10.5)

## 2015-05-25 LAB — CCBB MATERNAL DONOR DRAW

## 2015-05-25 LAB — RPR: RPR: NONREACTIVE

## 2015-05-25 MED ORDER — HYDROCORTISONE ACE-PRAMOXINE 1-1 % RE FOAM
Freq: Three times a day (TID) | RECTAL | Status: DC
  Administered 2015-05-25: 22:00:00 via RECTAL
  Filled 2015-05-25: qty 10

## 2015-05-25 MED ORDER — MAGNESIUM OXIDE 400 (241.3 MG) MG PO TABS
400.0000 mg | ORAL_TABLET | Freq: Every day | ORAL | Status: DC
  Administered 2015-05-26: 400 mg via ORAL
  Filled 2015-05-25 (×2): qty 1

## 2015-05-25 MED ORDER — DOCUSATE SODIUM 100 MG PO CAPS
100.0000 mg | ORAL_CAPSULE | Freq: Every day | ORAL | Status: DC
  Administered 2015-05-25: 100 mg via ORAL
  Filled 2015-05-25: qty 1

## 2015-05-25 NOTE — Anesthesia Postprocedure Evaluation (Signed)
  Anesthesia Post-op Note  Patient: Danielle Lambert  Procedure(s) Performed: * No procedures listed *  Patient Location: PACU and Mother/Baby  Anesthesia Type:Epidural  Level of Consciousness: awake, alert  and oriented  Airway and Oxygen Therapy: Patient Spontanous Breathing  Post-op Pain: mild  Post-op Assessment: Post-op Vital signs reviewed, Patient's Cardiovascular Status Stable, Respiratory Function Stable, No signs of Nausea or vomiting, Adequate PO intake, Pain level controlled, No headache, No backache and Patient able to bend at knees              Post-op Vital Signs: Reviewed and stable  Last Vitals:  Filed Vitals:   05/25/15 0920  BP: 110/48  Pulse: 63  Temp: 36.8 C  Resp: 18    Complications: No apparent anesthesia complications

## 2015-05-25 NOTE — Progress Notes (Signed)
Admission nutrition screen triggered for unintentional weight loss > 10 lbs within the last month.PNR indicates a 57 lb overall weight gain . Patients chart reviewed and assessed  for nutritional risk. Patient is determined to be at low nutrition  risk.   Weyman Rodney M.Fredderick Severance LDN Neonatal Nutrition Support Specialist/RD III Pager [phone]      Phone [phone]

## 2015-05-25 NOTE — Progress Notes (Signed)
Pt c/o constipation and hemorrhoid discomfort (medicated ointment given this morning for hemorrhoids and warm prune juice given at this time). Emelda Fear, CNM called and notified, new orders received and noted.

## 2015-05-25 NOTE — Progress Notes (Addendum)
PPD 1 SVD / VBAC  S:  Sleeping   O:               VS: BP 110/48 mmHg  Pulse 63  Temp(Src) 98.2 F (36.8 C) (Oral)  Resp 18  Ht 5\' 6"  (1.676 m)  Wt 107.502 kg (237 lb)  BMI 38.27 kg/m2  SpO2 97%  Breastfeeding? Unknown   LABS:              Recent Labs  05/24/15 0957 05/25/15 0535  WBC 11.9* 12.8*  HGB 8.6* 8.3*  PLT 278 214               Blood type: --/--/A POS (08/03 0957)  Rubella:                       I&O: Intake/Output      08/03 0701 - 08/04 0700 08/04 0701 - 08/05 0700   Urine (mL/kg/hr) 900    Blood 382    Total Output 1282     Net -1282              A: PPD # 1 VBAC   Doing well - stable status  P: Routine post partum orders  Will return later pm for PE  Artelia Laroche CNM, MSN, [facility] 05/25/2015, 4:06 PM    Re-visit @ 1945   feeling well / mild cramps most of the day up ad lib / voiding QS + hemorrhoid pain / hx constipation  Abdomen: soft and non-tender Fundus: firm and non-tender Perineum - no edema Extremities: trace edema

## 2015-05-26 MED ORDER — MAGNESIUM OXIDE 400 (241.3 MG) MG PO TABS: 400.0000 mg | ORAL_TABLET | Freq: Every day | ORAL | Status: DC

## 2015-05-26 MED ORDER — FERROUS SULFATE 325 (65 FE) MG PO TABS: 325.0000 mg | ORAL_TABLET | Freq: Two times a day (BID) | ORAL | Status: DC

## 2015-05-26 MED ORDER — IBUPROFEN 600 MG PO TABS: 600.0000 mg | ORAL_TABLET | Freq: Four times a day (QID) | ORAL | Status: DC

## 2015-05-26 MED ORDER — OXYCODONE-ACETAMINOPHEN 5-325 MG PO TABS: 1.0000 | ORAL_TABLET | ORAL | Status: DC | PRN

## 2015-05-26 MED ORDER — HYDROCORTISONE ACE-PRAMOXINE 1-1 % RE FOAM: Freq: Three times a day (TID) | RECTAL | Status: DC

## 2015-05-26 NOTE — Discharge Instructions (Signed)
Breast Pumping Tips °If you are breastfeeding, there may be times when you cannot feed your baby directly. Returning to work or going on a trip are common examples. Pumping allows you to store breast milk and feed it to your baby later.  °You may not get much milk when you first start to pump. Your breasts should start to make more after a few days. If you pump at the times you usually feed your baby, you may be able to keep making enough milk to feed your baby without also using formula. The more often you pump, the more milk you will produce.  °WHEN SHOULD I PUMP?  °· You can begin to pump soon after delivery. However, some experts recommend waiting about 4 weeks before giving your infant a bottle to make sure breastfeeding is going well.  °· If you plan to return to work, begin pumping a few weeks before. This will help you develop techniques that work best for you. It also lets you build up a supply of breast milk.   °· When you are with your infant, feed on demand and pump after each feeding.   °· When you are away from your infant for several hours, pump for about 15 minutes every 2-3 hours. Pump both breasts at the same time if you can.   °· If your infant has a formula feeding, make sure to pump around the same time.     °· If you drink any alcohol, wait 2 hours before pumping.   °HOW DO I PREPARE TO PUMP? °Your let-down reflex is the natural reaction to stimulation that makes your breast milk flow. It is easier to stimulate this reflex when you are relaxed. Find relaxation techniques that work for you. If you have difficulty with your let-down reflex, try these methods:  °· Smell one of your infant's blankets or an item of clothing.   °· Look at a picture or video of your infant.   °· Sit in a quiet, private space.   °· Massage the breast you plan to pump.   °· Place soothing warmth on the breast.   °· Play relaxing music.   °WHAT ARE SOME GENERAL BREAST PUMPING TIPS? °· Wash your hands before you pump. You  do not need to wash your nipples or breasts. °· There are three ways to pump. °¨ You can use your hand to massage and compress your breast. °¨ You can use a handheld manual pump. °¨ You can use an electric pump.   °· Make sure the suction cup (flange) on the breast pump is the right size. Place the flange directly over the nipple. If it is the wrong size or placed the wrong way, it may be painful and cause nipple damage.   °· If pumping is uncomfortable, apply a small amount of purified or modified lanolin to your nipple and areola. °· If you are using an electric pump, adjust the speed and suction power to be more comfortable. °· If pumping is painful or if you are not getting very much milk, you may need a different type of pump. A lactation consultant can help you determine what type of pump to use.   °· Keep a full water bottle near you at all times. Drinking lots of fluid helps you make more milk.  °· You can store your milk to use later. Pumped breast milk can be stored in a sealable, sterile container or plastic bag. Label all stored breast milk with the date you pumped it. °¨ Milk can stay out at room temperature for up to 8 hours. °¨   You can store your milk in the refrigerator for up to 8 days. °¨ You can store your milk in the freezer for 3 months. Thaw frozen milk using warm water. Do not put it in the microwave. °· Do not smoke. Smoking can lower your milk supply and harm your infant. If you need help quitting, ask your health care provider to recommend a program.   °WHEN SHOULD I CALL MY HEALTH CARE PROVIDER OR A LACTATION CONSULTANT? °· You are having trouble pumping. °· You are concerned that you are not making enough milk. °· You have nipple pain, soreness, or redness. °· You want to use birth control. Birth control pills may lower your milk supply. Talk to your health care provider about your options. °Document Released: 03/27/2010 Document Revised: 10/12/2013 Document Reviewed:  07/30/2013 °[employer]® Patient Information ©2015 [employer]. This information is not intended to replace advice given to you by your health care provider. Make sure you discuss any questions you have with your health care provider. ° °Nutrition for the New Mother  °A new mother needs good health and nutrition so she can have energy to take care of a new baby. Whether a mother breastfeeds or formula feeds the baby, it is important to have a well-balanced diet. Foods from all the food groups should be chosen to meet the new mother's energy needs and to give her the nutrients needed for repair and healing.  °A HEALTHY EATING PLAN °The My Pyramid plan for Moms outlines what you should eat to help you and your baby stay healthy. The energy and amount of food you need depends on whether or not you are breastfeeding. If you are breastfeeding you will need more nutrients. If you choose not to breastfeed, your nutrition goal should be to return to a healthy weight. Limiting calories may be needed if you are not breastfeeding.  °HOME CARE INSTRUCTIONS  °· For a personal plan based on your unique needs, see your Registered Dietitian or visit www.mypyramid.gov. °· Eat a variety of foods. The plan below will help guide you. The following chart has a suggested daily meal plan from the My Pyramid for Moms. °· Eat a variety of fruits and vegetables. °· Eat more dark green and orange vegetables and cooked dried beans. °· Make half your grains whole grains. Choose whole instead of refined grains. °· Choose low-fat or lean meats and poultry. °· Choose low-fat or fat-free dairy products like milk, cheese, or yogurt. °Fruits °· Breastfeeding: 2 cups °· Non-Breastfeeding: 2 cups °· What Counts as a serving? °¨ 1 cup of fruit or juice. °¨ ½ cup dried fruit. °Vegetables °· Breastfeeding: 3 cups °· Non-Breastfeeding: 2 ½ cups °· What Counts as a serving? °¨ 1 cup raw or cooked vegetables. °¨ Juice or 2 cups raw leafy  vegetables. °Grains °· Breastfeeding: 8 oz °· Non-Breastfeeding: 6 oz °· What Counts as a serving? °¨ 1 slice bread. °¨ 1 oz ready-to-eat cereal. °¨ ½ cup cooked pasta, rice, or cereal. °Meat and Beans °· Breastfeeding: 6 ½ oz °· Non-Breastfeeding: 5 ½ oz °· What Counts as a serving? °¨ 1 oz lean meat, poultry, or fish °¨ ¼ cup cooked dry beans °¨ ½ oz nuts or 1 egg °¨ 1 tbs peanut butter °Milk °· Breastfeeding: 3 cups °· Non-Breastfeeding: 3 cups °· What Counts as a serving? °¨ 1 cup milk. °¨ 8 oz yogurt. °¨ 1 ½ oz cheese. °¨ 2 oz processed cheese. °TIPS FOR THE BREASTFEEDING MOM °· Rapid weight   loss is not suggested when you are breastfeeding. By simply breastfeeding, you will be able to lose the weight gained during your pregnancy. Your caregiver can keep track of your weight and tell you if your weight loss is appropriate.  Be sure to drink fluids. You may notice that you are thirstier than usual. A suggestion is to drink a glass of water or other beverage whenever you breastfeed.  Avoid alcohol as it can be passed into your breast milk.  Limit caffeine drinks to no more than 2 to 3 cups per day.  You may need to keep taking your prenatal vitamin while you are breastfeeding. Talk with your caregiver about taking a vitamin or supplement. RETURING TO A HEALTHY WEIGHT  The My Pyramid Plan for Moms will help you return to a healthy weight. It will also provide the nutrients you need.  You may need to limit "empty" calories. These include:  High fat foods like fried foods, fatty meats, fast food, butter, and mayonnaise.  High sugar foods like sodas, jelly, candy, and sweets.  Be physically active. Include 30 minutes of exercise or more each day. Choose an activity you like such as walking, swimming, biking, or aerobics. Check with your caregiver before you start to exercise. Document Released: 01/14/2008 Document Revised: 12/30/2011 Document Reviewed: 01/14/2008 [facility] Patient Information  2015 [place], [place]. This information is not intended to replace advice given to you by your health care provider. Make sure you discuss any questions you have with your health care provider. Postpartum Depression and Baby Blues The postpartum period begins right after the birth of a baby. During this time, there is often a great amount of joy and excitement. It is also a time of many changes in the life of the parents. Regardless of how many times a mother gives birth, each child brings new challenges and dynamics to the family. It is not unusual to have feelings of excitement along with confusing shifts in moods, emotions, and thoughts. All mothers are at risk of developing postpartum depression or the "baby blues." These mood changes can occur right after giving birth, or they may occur many months after giving birth. The baby blues or postpartum depression can be mild or severe. Additionally, postpartum depression can go away rather quickly, or it can be a long-term condition.  CAUSES Raised hormone levels and the rapid drop in those levels are thought to be a main cause of postpartum depression and the baby blues. A number of hormones change during and after pregnancy. Estrogen and progesterone usually decrease right after the delivery of your baby. The levels of thyroid hormone and various cortisol steroids also rapidly drop. Other factors that play a role in these mood changes include major life events and genetics.  RISK FACTORS If you have any of the following risks for the baby blues or postpartum depression, know what symptoms to watch out for during the postpartum period. Risk factors that may increase the likelihood of getting the baby blues or postpartum depression include:  Having a personal or family history of depression.   Having depression while being pregnant.   Having premenstrual mood issues or mood issues related to oral contraceptives.  Having a lot of life stress.   Having  marital conflict.   Lacking a social support network.   Having a baby with special needs.   Having health problems, such as diabetes.  SIGNS AND SYMPTOMS Symptoms of baby blues include:  Brief changes in mood, such as going  from extreme happiness to sadness.  Decreased concentration.   Difficulty sleeping.   Crying spells, tearfulness.   Irritability.   Anxiety.  Symptoms of postpartum depression typically begin within the first month after giving birth. These symptoms include:  Difficulty sleeping or excessive sleepiness.   Marked weight loss.   Agitation.   Feelings of worthlessness.   Lack of interest in activity or food.  Postpartum psychosis is a very serious condition and can be dangerous. Fortunately, it is rare. Displaying any of the following symptoms is cause for immediate medical attention. Symptoms of postpartum psychosis include:   Hallucinations and delusions.   Bizarre or disorganized behavior.   Confusion or disorientation.  DIAGNOSIS  A diagnosis is made by an evaluation of your symptoms. There are no medical or lab tests that lead to a diagnosis, but there are various questionnaires that a health care provider may use to identify those with the baby blues, postpartum depression, or psychosis. Often, a screening tool called the [place] Postnatal Depression Scale is used to diagnose depression in the postpartum period.  TREATMENT The baby blues usually goes away on its own in 1-2 weeks. Social support is often all that is needed. You will be encouraged to get adequate sleep and rest. Occasionally, you may be given medicines to help you sleep.  Postpartum depression requires treatment because it can last several months or longer if it is not treated. Treatment may include individual or group therapy, medicine, or both to address any social, physiological, and psychological factors that may play a role in the depression. Regular exercise, a  healthy diet, rest, and social support may also be strongly recommended.  Postpartum psychosis is more serious and needs treatment right away. Hospitalization is often needed. HOME CARE INSTRUCTIONS  Get as much rest as you can. Nap when the baby sleeps.   Exercise regularly. Some women find yoga and walking to be beneficial.   Eat a balanced and nourishing diet.   Do little things that you enjoy. Have a cup of tea, take a bubble bath, read your favorite magazine, or listen to your favorite music.  Avoid alcohol.   Ask for help with household chores, cooking, grocery shopping, or running errands as needed. Do not try to do everything.   Talk to people close to you about how you are feeling. Get support from your partner, family members, friends, or other new moms.  Try to stay positive in how you think. Think about the things you are grateful for.   Do not spend a lot of time alone.   Only take over-the-counter or prescription medicine as directed by your health care provider.  Keep all your postpartum appointments.   Let your health care provider know if you have any concerns.  SEEK MEDICAL CARE IF: You are having a reaction to or problems with your medicine. SEEK IMMEDIATE MEDICAL CARE IF:  You have suicidal feelings.   You think you may harm the baby or someone else. MAKE SURE YOU:  Understand these instructions.  Will watch your condition.  Will get help right away if you are not doing well or get worse. Document Released: 07/11/2004 Document Revised: 10/12/2013 Document Reviewed: 07/19/2013 [facility] Patient Information 2015 [place], [place]. This information is not intended to replace advice given to you by your health care provider. Make sure you discuss any questions you have with your health care provider. Postpartum Care After Vaginal Delivery After you deliver your newborn (postpartum period), the usual stay in  the hospital is 24-72 hours. If there were  problems with your labor or delivery, or if you have other medical problems, you might be in the hospital longer.  While you are in the hospital, you will receive help and instructions on how to care for yourself and your newborn during the postpartum period.  While you are in the hospital:  Be sure to tell your nurses if you have pain or discomfort, as well as where you feel the pain and what makes the pain worse.  If you had an incision made near your vagina (episiotomy) or if you had some tearing during delivery, the nurses may put ice packs on your episiotomy or tear. The ice packs may help to reduce the pain and swelling.  If you are breastfeeding, you may feel uncomfortable contractions of your uterus for a couple of weeks. This is normal. The contractions help your uterus get back to normal size.  It is normal to have some bleeding after delivery.  For the first 1-3 days after delivery, the flow is red and the amount may be similar to a period.  It is common for the flow to start and stop.  In the first few days, you may pass some small clots. Let your nurses know if you begin to pass large clots or your flow increases.  Do not  flush blood clots down the toilet before having the nurse look at them.  During the next 3-10 days after delivery, your flow should become more watery and pink or brown-tinged in color.  Ten to fourteen days after delivery, your flow should be a small amount of yellowish-white discharge.  The amount of your flow will decrease over the first few weeks after delivery. Your flow may stop in 6-8 weeks. Most women have had their flow stop by 12 weeks after delivery.  You should change your sanitary pads frequently.  Wash your hands thoroughly with soap and water for at least 20 seconds after changing pads, using the toilet, or before holding or feeding your newborn.  You should feel like you need to empty your bladder within the first 6-8 hours after  delivery.  In case you become weak, lightheaded, or faint, call your nurse before you get out of bed for the first time and before you take a shower for the first time.  Within the first few days after delivery, your breasts may begin to feel tender and full. This is called engorgement. Breast tenderness usually goes away within 48-72 hours after engorgement occurs. You may also notice milk leaking from your breasts. If you are not breastfeeding, do not stimulate your breasts. Breast stimulation can make your breasts produce more milk.  Spending as much time as possible with your newborn is very important. During this time, you and your newborn can feel close and get to know each other. Having your newborn stay in your room (rooming in) will help to strengthen the bond with your newborn. It will give you time to get to know your newborn and become comfortable caring for your newborn.  Your hormones change after delivery. Sometimes the hormone changes can temporarily cause you to feel sad or tearful. These feelings should not last more than a few days. If these feelings last longer than that, you should talk to your caregiver.  If desired, talk to your caregiver about methods of family planning or contraception.  Talk to your caregiver about immunizations. Your caregiver may want you to have the  following immunizations before leaving the hospital:  Tetanus, diphtheria, and pertussis (Tdap) or tetanus and diphtheria (Td) immunization. It is very important that you and your family (including grandparents) or others caring for your newborn are up-to-date with the Tdap or Td immunizations. The Tdap or Td immunization can help protect your newborn from getting ill.  Rubella immunization.  Varicella (chickenpox) immunization.  Influenza immunization. You should receive this annual immunization if you did not receive the immunization during your pregnancy. Document Released: 08/04/2007 Document  Revised: 07/01/2012 Document Reviewed: 06/03/2012 [facility] Patient Information 2015 [place], [place]. This information is not intended to replace advice given to you by your health care provider. Make sure you discuss any questions you have with your health care provider. Vaginal Birth After Cesarean Delivery Vaginal birth after cesarean delivery (VBAC) is giving birth vaginally after previously delivering a baby by a cesarean. In the past, if a woman had a cesarean delivery, all births afterward would be done by cesarean delivery. This is no longer true. It can be safe for the mother to try a vaginal delivery after having a cesarean delivery.  It is important to discuss VBAC with your health care provider early in the pregnancy so you can understand the risks, benefits, and options. It will give you time to decide what is best in your particular case. The final decision about whether to have a VBAC or repeat cesarean delivery should be between you and your health care provider. Any changes in your health or your baby's health during your pregnancy may make it necessary to change your initial decision about VBAC.  WOMEN WHO PLAN TO HAVE A VBAC SHOULD CHECK WITH THEIR HEALTH CARE PROVIDER TO BE SURE THAT:  The previous cesarean delivery was done with a low transverse uterine cut (incision) (not a vertical classical incision).   The birth canal is big enough for the baby.   There were no other operations on the uterus.   An electronic fetal monitor (EFM) will be on at all times during labor.   An operating room will be available and ready in case an emergency cesarean delivery is needed.   A health care provider and surgical nursing staff will be available at all times during labor to be ready to do an emergency delivery cesarean if necessary.   An anesthesiologist will be present in case an emergency cesarean delivery is needed.   The nursery is prepared and has adequate personnel and  necessary equipment available to care for the baby in case of an emergency cesarean delivery. BENEFITS OF VBAC  Shorter stay in the hospital.   Avoidance of risks associated with cesarean delivery, such as:  Surgical complications, such as opening of the incision or hernia in the incision.  Injury to other organs.  Fever. This can occur if an infection develops after surgery. It can also occur as a reaction to the medicine given to make you numb during the surgery.  Less blood loss and need for blood transfusions.  Lower risk of blood clots and infection.  Shorter recovery.   Decreased risk for having to remove the uterus (hysterectomy).   Decreased risk for the placenta to completely or partially cover the opening of the uterus (placenta previa) with a future pregnancy.   Decrease risk in future labor and delivery. RISKS OF A VBAC  Tearing (rupture) of the uterus. This is occurs in less than 1% of VBACs. The risk of this happening is higher if:  Steps are taken  to begin the labor process (induce labor) or stimulate or strengthen contractions (augment labor).   Medicine is used to soften (ripen) the cervix.  Having to remove the uterus (hysterectomy) if it ruptures. VBAC SHOULD NOT BE DONE IF:  The previous cesarean delivery was done with a vertical (classical) or T-shaped incision or you do not know what kind of incision was made.   You had a ruptured uterus.   You have had certain types of surgery on your uterus, such as removal of uterine fibroids. Ask your health care provider about other types of surgeries that prevent you from having a VBAC.  You have certain medical or childbirth (obstetrical) problems.   There are problems with the baby.   You have had two previous cesarean deliveries and no vaginal deliveries. OTHER FACTS TO KNOW ABOUT VBAC:  It is safe to have an epidural anesthetic with VBAC.   It is safe to turn the baby from a breech position  (attempt an external cephalic version).   It is safe to try a VBAC with twins.   VBAC may not be successful if your baby weights 8.8 lb (4 kg) or more. However, weight predictions are not always accurate and should not be used alone to decide if VBAC is right for you.  There is an increased failure rate if the time between the cesarean delivery and VBAC is less than 19 months.   Your health care provider may advise against a VBAC if you have preeclampsia (high blood pressure, protein in the urine, and swelling of face and extremities).   VBAC is often successful if you previously gave birth vaginally.   VBAC is often successful when the labor starts spontaneously before the due date.   Delivering a baby through a VBAC is similar to having a normal spontaneous vaginal delivery. Document Released: 03/30/2007 Document Revised: 02/21/2014 Document Reviewed: 05/06/2013 [facility] Patient Information 2015 [place], [place]. This information is not intended to replace advice given to you by your health care provider. Make sure you discuss any questions you have with your health care provider. Breastfeeding and Mastitis Mastitis is inflammation of the breast tissue. It can occur in women who are breastfeeding. This can make breastfeeding painful. Mastitis will sometimes go away on its own. Your health care provider will help determine if treatment is needed. CAUSES Mastitis is often associated with a blocked milk (lactiferous) duct. This can happen when too much milk builds up in the breast. Causes of excess milk in the breast can include:  Poor latch-on. If your baby is not latched onto the breast properly, she or he may not empty your breast completely while breastfeeding.  Allowing too much time to pass between feedings.  Wearing a bra or other clothing that is too tight. This puts extra pressure on the lactiferous ducts so milk does not flow through them as it should. Mastitis can also be  caused by a bacterial infection. Bacteria may enter the breast tissue through cuts or openings in the skin. In women who are breastfeeding, this may occur because of cracked or irritated skin. Cracks in the skin are often caused when your baby does not latch on properly to the breast. SIGNS AND SYMPTOMS  Swelling, redness, tenderness, and pain in an area of the breast.  Swelling of the glands under the arm on the same side.  Fever may or may not accompany mastitis. If an infection is allowed to progress, a collection of pus (abscess) may develop. DIAGNOSIS  Your health care provider can usually diagnose mastitis based on your symptoms and a physical exam. Tests may be done to help confirm the diagnosis. These may include:  Removal of pus from the breast by applying pressure to the area. This pus can be examined in the lab to determine which bacteria are present. If an abscess has developed, the fluid in the abscess can be removed with a needle. This can also be used to confirm the diagnosis and determine the bacteria present. In most cases, pus will not be present.  Blood tests to determine if your body is fighting a bacterial infection.  Mammogram or ultrasound tests to rule out other problems or diseases. TREATMENT  Mastitis that occurs with breastfeeding will sometimes go away on its own. Your health care provider may choose to wait 24 hours after first seeing you to decide whether a prescription medicine is needed. If your symptoms are worse after 24 hours, your health care provider will likely prescribe an antibiotic medicine to treat the mastitis. He or she will determine which bacteria are most likely causing the infection and will then select an appropriate antibiotic medicine. This is sometimes changed based on the results of tests performed to identify the bacteria, or if there is no response to the antibiotic medicine selected. Antibiotic medicines are usually given by mouth. You may  also be given medicine for pain. HOME CARE INSTRUCTIONS  Only take over-the-counter or prescription medicines for pain, fever, or discomfort as directed by your health care provider.  If your health care provider prescribed an antibiotic medicine, take the medicine as directed. Make sure you finish it even if you start to feel better.  Do not wear a tight or underwire bra. Wear a soft, supportive bra.  Increase your fluid intake, especially if you have a fever.  Continue to empty the breast. Your health care provider can tell you whether this milk is safe for your infant or needs to be thrown out. You may be told to stop nursing until your health care provider thinks it is safe for your baby. Use a breast pump if you are advised to stop nursing.  Keep your nipples clean and dry.  Empty the first breast completely before going to the other breast. If your baby is not emptying your breasts completely for some reason, use a breast pump to empty your breasts.  If you go back to work, pump your breasts while at work to stay in time with your nursing schedule.  Avoid allowing your breasts to become overly filled with milk (engorged). SEEK MEDICAL CARE IF:  You have pus-like discharge from the breast.  Your symptoms do not improve with the treatment prescribed by your health care provider within 2 days. SEEK IMMEDIATE MEDICAL CARE IF:  Your pain and swelling are getting worse.  You have pain that is not controlled with medicine.  You have a red line extending from the breast toward your armpit.  You have a fever or persistent symptoms for more than 2-3 days.  You have a fever and your symptoms suddenly get worse. MAKE SURE YOU:   Understand these instructions.  Will watch your condition.  Will get help right away if you are not doing well or get worse. Document Released: 02/01/2005 Document Revised: 10/12/2013 Document Reviewed: 05/13/2013 [facility] Patient Information 2015  [place], [place]. This information is not intended to replace advice given to you by your health care provider. Make sure you discuss any questions you have  with your health care provider. Breastfeeding Deciding to breastfeed is one of the best choices you can make for you and your baby. A change in hormones during pregnancy causes your breast tissue to grow and increases the number and size of your milk ducts. These hormones also allow proteins, sugars, and fats from your blood supply to make breast milk in your milk-producing glands. Hormones prevent breast milk from being released before your baby is born as well as prompt milk flow after birth. Once breastfeeding has begun, thoughts of your baby, as well as his or her sucking or crying, can stimulate the release of milk from your milk-producing glands.  BENEFITS OF BREASTFEEDING For Your Baby  Your first milk (colostrum) helps your baby's digestive system function better.   There are antibodies in your milk that help your baby fight off infections.   Your baby has a lower incidence of asthma, allergies, and sudden infant death syndrome.   The nutrients in breast milk are better for your baby than infant formulas and are designed uniquely for your baby's needs.   Breast milk improves your baby's brain development.   Your baby is less likely to develop other conditions, such as childhood obesity, asthma, or type 2 diabetes mellitus.  For You   Breastfeeding helps to create a very special bond between you and your baby.   Breastfeeding is convenient. Breast milk is always available at the correct temperature and costs nothing.   Breastfeeding helps to burn calories and helps you lose the weight gained during pregnancy.   Breastfeeding makes your uterus contract to its prepregnancy size faster and slows bleeding (lochia) after you give birth.   Breastfeeding helps to lower your risk of developing type 2 diabetes mellitus,  osteoporosis, and breast or ovarian cancer later in life. SIGNS THAT YOUR BABY IS HUNGRY Early Signs of Hunger  Increased alertness or activity.  Stretching.  Movement of the head from side to side.  Movement of the head and opening of the mouth when the corner of the mouth or cheek is stroked (rooting).  Increased sucking sounds, smacking lips, cooing, sighing, or squeaking.  Hand-to-mouth movements.  Increased sucking of fingers or hands. Late Signs of Hunger  Fussing.  Intermittent crying. Extreme Signs of Hunger Signs of extreme hunger will require calming and consoling before your baby will be able to breastfeed successfully. Do not wait for the following signs of extreme hunger to occur before you initiate breastfeeding:   Restlessness.  A loud, strong cry.   Screaming. BREASTFEEDING BASICS Breastfeeding Initiation  Find a comfortable place to sit or lie down, with your neck and back well supported.  Place a pillow or rolled up blanket under your baby to bring him or her to the level of your breast (if you are seated). Nursing pillows are specially designed to help support your arms and your baby while you breastfeed.  Make sure that your baby's abdomen is facing your abdomen.   Gently massage your breast. With your fingertips, massage from your chest wall toward your nipple in a circular motion. This encourages milk flow. You may need to continue this action during the feeding if your milk flows slowly.  Support your breast with 4 fingers underneath and your thumb above your nipple. Make sure your fingers are well away from your nipple and your baby's mouth.   Stroke your baby's lips gently with your finger or nipple.   When your baby's mouth is open wide enough, quickly  bring your baby to your breast, placing your entire nipple and as much of the colored area around your nipple (areola) as possible into your baby's mouth.   More areola should be visible  above your baby's upper lip than below the lower lip.   Your baby's tongue should be between his or her lower gum and your breast.   Ensure that your baby's mouth is correctly positioned around your nipple (latched). Your baby's lips should create a seal on your breast and be turned out (everted).  It is common for your baby to suck about 2-3 minutes in order to start the flow of breast milk. Latching Teaching your baby how to latch on to your breast properly is very important. An improper latch can cause nipple pain and decreased milk supply for you and poor weight gain in your baby. Also, if your baby is not latched onto your nipple properly, he or she may swallow some air during feeding. This can make your baby fussy. Burping your baby when you switch breasts during the feeding can help to get rid of the air. However, teaching your baby to latch on properly is still the best way to prevent fussiness from swallowing air while breastfeeding. Signs that your baby has successfully latched on to your nipple:    Silent tugging or silent sucking, without causing you pain.   Swallowing heard between every 3-4 sucks.    Muscle movement above and in front of his or her ears while sucking.  Signs that your baby has not successfully latched on to nipple:   Sucking sounds or smacking sounds from your baby while breastfeeding.  Nipple pain. If you think your baby has not latched on correctly, slip your finger into the corner of your baby's mouth to break the suction and place it between your baby's gums. Attempt breastfeeding initiation again. Signs of Successful Breastfeeding Signs from your baby:   A gradual decrease in the number of sucks or complete cessation of sucking.   Falling asleep.   Relaxation of his or her body.   Retention of a small amount of milk in his or her mouth.   Letting go of your breast by himself or herself. Signs from you:  Breasts that have increased in  firmness, weight, and size 1-3 hours after feeding.   Breasts that are softer immediately after breastfeeding.  Increased milk volume, as well as a change in milk consistency and color by the fifth day of breastfeeding.   Nipples that are not sore, cracked, or bleeding. Signs That Your [employer]el Books is Getting Enough Milk  Wetting at least 3 diapers in a 24-hour period. The urine should be clear and pale yellow by age 32 days.  At least 3 stools in a 24-hour period by age 32 days. The stool should be soft and yellow.  At least 3 stools in a 24-hour period by age 50 days. The stool should be seedy and yellow.  No loss of weight greater than 10% of birth weight during the first 50 days of age.  Average weight gain of 4-7 ounces (113-198 g) per week after age 65 days.  Consistent daily weight gain by age 36 days, without weight loss after the age of 2 weeks. After a feeding, your baby may spit up a small amount. This is common. BREASTFEEDING FREQUENCY AND DURATION Frequent feeding will help you make more milk and can prevent sore nipples and breast engorgement. Breastfeed when you feel the need to reduce  the fullness of your breasts or when your baby shows signs of hunger. This is called "breastfeeding on demand." Avoid introducing a pacifier to your baby while you are working to establish breastfeeding (the first 4-6 weeks after your baby is born). After this time you may choose to use a pacifier. Research has shown that pacifier use during the first year of a baby's life decreases the risk of sudden infant death syndrome (SIDS). Allow your baby to feed on each breast as long as he or she wants. Breastfeed until your baby is finished feeding. When your baby unlatches or falls asleep while feeding from the first breast, offer the second breast. Because newborns are often sleepy in the first few weeks of life, you may need to awaken your baby to get him or her to feed. Breastfeeding times will vary from baby  to baby. However, the following rules can serve as a guide to help you ensure that your baby is properly fed:  Newborns (babies 36 weeks of age or younger) may breastfeed every 1-3 hours.  Newborns should not go longer than 3 hours during the day or 5 hours during the night without breastfeeding.  You should breastfeed your baby a minimum of 8 times in a 24-hour period until you begin to introduce solid foods to your baby at around 33 months of age. BREAST MILK PUMPING Pumping and storing breast milk allows you to ensure that your baby is exclusively fed your breast milk, even at times when you are unable to breastfeed. This is especially important if you are going back to work while you are still breastfeeding or when you are not able to be present during feedings. Your lactation consultant can give you guidelines on how long it is safe to store breast milk.  A breast pump is a machine that allows you to pump milk from your breast into a sterile bottle. The pumped breast milk can then be stored in a refrigerator or freezer. Some breast pumps are operated by hand, while others use electricity. Ask your lactation consultant which type will work best for you. Breast pumps can be purchased, but some hospitals and breastfeeding support groups lease breast pumps on a monthly basis. A lactation consultant can teach you how to hand express breast milk, if you prefer not to use a pump.  CARING FOR YOUR BREASTS WHILE YOU BREASTFEED Nipples can become dry, cracked, and sore while breastfeeding. The following recommendations can help keep your breasts moisturized and healthy:  Avoid using soap on your nipples.   Wear a supportive bra. Although not required, special nursing bras and tank tops are designed to allow access to your breasts for breastfeeding without taking off your entire bra or top. Avoid wearing underwire-style bras or extremely tight bras.  Air dry your nipples for 3-77minutes after each feeding.    Use only cotton bra pads to absorb leaked breast milk. Leaking of breast milk between feedings is normal.   Use lanolin on your nipples after breastfeeding. Lanolin helps to maintain your skin's normal moisture barrier. If you use pure lanolin, you do not need to wash it off before feeding your baby again. Pure lanolin is not toxic to your baby. You may also hand express a few drops of breast milk and gently massage that milk into your nipples and allow the milk to air dry. In the first few weeks after giving birth, some women experience extremely full breasts (engorgement). Engorgement can make your breasts feel heavy,  warm, and tender to the touch. Engorgement peaks within 3-5 days after you give birth. The following recommendations can help ease engorgement:  Completely empty your breasts while breastfeeding or pumping. You may want to start by applying warm, moist heat (in the shower or with warm water-soaked hand towels) just before feeding or pumping. This increases circulation and helps the milk flow. If your baby does not completely empty your breasts while breastfeeding, pump any extra milk after he or she is finished.  Wear a snug bra (nursing or regular) or tank top for 1-2 days to signal your body to slightly decrease milk production.  Apply ice packs to your breasts, unless this is too uncomfortable for you.  Make sure that your baby is latched on and positioned properly while breastfeeding. If engorgement persists after 48 hours of following these recommendations, contact your health care provider or a Science writer. OVERALL HEALTH CARE RECOMMENDATIONS WHILE BREASTFEEDING  Eat healthy foods. Alternate between meals and snacks, eating 3 of each per day. Because what you eat affects your breast milk, some of the foods may make your baby more irritable than usual. Avoid eating these foods if you are sure that they are negatively affecting your baby.  Drink milk, fruit juice,  and water to satisfy your thirst (about 10 glasses a day).   Rest often, relax, and continue to take your prenatal vitamins to prevent fatigue, stress, and anemia.  Continue breast self-awareness checks.  Avoid chewing and smoking tobacco.  Avoid alcohol and drug use. Some medicines that may be harmful to your baby can pass through breast milk. It is important to ask your health care provider before taking any medicine, including all over-the-counter and prescription medicine as well as vitamin and herbal supplements. It is possible to become pregnant while breastfeeding. If birth control is desired, ask your health care provider about options that will be safe for your baby. SEEK MEDICAL CARE IF:   You feel like you want to stop breastfeeding or have become frustrated with breastfeeding.  You have painful breasts or nipples.  Your nipples are cracked or bleeding.  Your breasts are red, tender, or warm.  You have a swollen area on either breast.  You have a fever or chills.  You have nausea or vomiting.  You have drainage other than breast milk from your nipples.  Your breasts do not become full before feedings by the fifth day after you give birth.  You feel sad and depressed.  Your baby is too sleepy to eat well.  Your baby is having trouble sleeping.   Your baby is wetting less than 3 diapers in a 24-hour period.  Your baby has less than 3 stools in a 24-hour period.  Your baby's skin or the white part of his or her eyes becomes yellow.   Your baby is not gaining weight by 75 days of age. SEEK IMMEDIATE MEDICAL CARE IF:   Your baby is overly tired (lethargic) and does not want to wake up and feed.  Your baby develops an unexplained fever. Document Released: 10/07/2005 Document Revised: 10/12/2013 Document Reviewed: 03/31/2013 [facility] Patient Information 2015 [place], [place]. This information is not intended to replace advice given to you by your health care  provider. Make sure you discuss any questions you have with your health care provider.

## 2015-05-26 NOTE — Discharge Summary (Signed)
Obstetric Discharge Summary Reason for Admission: induction of labor Prenatal Procedures: ultrasound Intrapartum Course: Admitted for IOL d/t grand multiparity / pitocin for IOL / epidural for pain management / normal progression to complete dilation / spontaneous VBAC with intact perineum by Dr. Garwin Brothers / no immediate postpartum complications noted. Intrapartum Procedures: spontaneous vaginal birth after cesarean delivery Postpartum Procedures: none Complications-Operative and Postpartum: none HEMOGLOBIN  Date Value Ref Range Status  05/25/2015 8.3* 12.0 - 15.0 g/dL Final   HCT  Date Value Ref Range Status  05/25/2015 27.3* 36.0 - 46.0 % Final    Physical Exam:  General: alert, cooperative and no distress Lochia: appropriate Uterine Fundus: firm, midline, U-2 Perineum: intact DVT Evaluation: No evidence of DVT seen on physical exam. Negative Homan's sign. No cords or calf tenderness. No significant calf/ankle edema.  Discharge Diagnoses: Term Pregnancy-delivered  Discharge Information: Date: 05/26/2015 Activity: pelvic rest Diet: routine Medications: PNV, Ibuprofen, Iron and Percocet Condition: stable Instructions: refer to practice specific booklet Discharge to: home Follow-up Information    Follow up with COUSINS,SHERONETTE A, MD. Go in 6 weeks.   Specialty:  Obstetrics and Gynecology   Why:  postpartum visit   Contact information:   [address] [place] [address] [phone]       Newborn Data: Live born female on 05/24/2015 Birth Weight: 8 lb 11.2 oz (3946 g) APGAR: 8, 9  Home with mother.  Laury Deep, M MSN, CNM 05/26/2015, 10:13 AM

## 2015-05-26 NOTE — Progress Notes (Signed)
Patient ID: Danielle Lambert, female   DOB: Feb 19, 1981, 34 y.o.   MRN: [id number] Post Partum Day #2 S/P VBAC   Information for the patient's newborn:  Danielle Lambert, [name] [[id number]]  female  Feeding: breast  Subjective: No HA, SOB, CP, F/C, breast symptoms. Pain well-controlled with Ibuprofen and Percocet. Normal vaginal bleeding, no clots.      Objective:  Temp:  [97.3 F (36.3 C)-98.4 F (36.9 C)] 98.4 F (36.9 C) (08/05 0650) Pulse Rate:  [58-69] 58 (08/05 0650) Resp:  [18] 18 (08/05 0650) BP: (94-123)/(47-63) 94/47 mmHg (08/05 0650)      Recent Labs  05/24/15 0957 05/25/15 0535  WBC 11.9* 12.8*  HGB 8.6* 8.3*  HCT 28.9* 27.3*  PLT 278 214    Blood type: A POS (08/03 0957) Rubella:  Immune   Physical Exam:  General: alert, cooperative and no distress Uterine Fundus: firm, midline, U-2 Lochia: appropriate Perineum: intact DVT Evaluation: No evidence of DVT seen on physical exam. Negative Homan's sign. No cords or calf tenderness. No significant calf/ankle edema.    Assessment/Plan: PPD # 2 / 34 y.o., [id number] S/P: induced vaginal  Principal Problem:    Vaginal birth after cesarean (VBAC) (8/3)  Active Problems:    Pregnancy    Grand multiparity in labor and delivery    Postpartum care following vaginal delivery   normal postpartum exam  Continue current postpartum care  D/C home   LOS: 2 days   Renato Battles, Landon Truax, M, MSN, CNM 05/26/2015, 9:45 AM

## 2016-08-06 DIAGNOSIS — L03314 Cellulitis of groin: Secondary | ICD-10-CM | POA: Diagnosis not present

## 2016-09-18 DIAGNOSIS — Z01419 Encounter for gynecological examination (general) (routine) without abnormal findings: Secondary | ICD-10-CM | POA: Diagnosis not present

## 2016-09-18 DIAGNOSIS — Z6828 Body mass index (BMI) 28.0-28.9, adult: Secondary | ICD-10-CM | POA: Diagnosis not present

## 2016-09-18 DIAGNOSIS — Z1151 Encounter for screening for human papillomavirus (HPV): Secondary | ICD-10-CM | POA: Diagnosis not present

## 2017-01-15 DIAGNOSIS — R1033 Periumbilical pain: Secondary | ICD-10-CM | POA: Diagnosis not present

## 2017-01-15 DIAGNOSIS — K5900 Constipation, unspecified: Secondary | ICD-10-CM | POA: Diagnosis not present

## 2017-02-19 DIAGNOSIS — D485 Neoplasm of uncertain behavior of skin: Secondary | ICD-10-CM | POA: Diagnosis not present

## 2017-02-19 DIAGNOSIS — D229 Melanocytic nevi, unspecified: Secondary | ICD-10-CM | POA: Diagnosis not present

## 2017-02-19 DIAGNOSIS — L812 Freckles: Secondary | ICD-10-CM | POA: Diagnosis not present

## 2017-02-19 DIAGNOSIS — D225 Melanocytic nevi of trunk: Secondary | ICD-10-CM | POA: Diagnosis not present

## 2017-02-19 DIAGNOSIS — D2261 Melanocytic nevi of right upper limb, including shoulder: Secondary | ICD-10-CM | POA: Diagnosis not present

## 2017-02-19 DIAGNOSIS — Z1283 Encounter for screening for malignant neoplasm of skin: Secondary | ICD-10-CM | POA: Diagnosis not present

## 2017-09-23 DIAGNOSIS — J029 Acute pharyngitis, unspecified: Secondary | ICD-10-CM | POA: Diagnosis not present

## 2018-08-20 DIAGNOSIS — Z68.41 Body mass index (BMI) pediatric, 5th percentile to less than 85th percentile for age: Secondary | ICD-10-CM | POA: Diagnosis not present

## 2018-08-20 DIAGNOSIS — Z00129 Encounter for routine child health examination without abnormal findings: Secondary | ICD-10-CM | POA: Diagnosis not present

## 2018-09-30 DIAGNOSIS — Z1151 Encounter for screening for human papillomavirus (HPV): Secondary | ICD-10-CM | POA: Diagnosis not present

## 2018-09-30 DIAGNOSIS — Z124 Encounter for screening for malignant neoplasm of cervix: Secondary | ICD-10-CM | POA: Diagnosis not present

## 2018-09-30 DIAGNOSIS — Z01419 Encounter for gynecological examination (general) (routine) without abnormal findings: Secondary | ICD-10-CM | POA: Diagnosis not present

## 2018-09-30 DIAGNOSIS — Z1231 Encounter for screening mammogram for malignant neoplasm of breast: Secondary | ICD-10-CM | POA: Diagnosis not present

## 2018-09-30 DIAGNOSIS — Z6830 Body mass index (BMI) 30.0-30.9, adult: Secondary | ICD-10-CM | POA: Diagnosis not present

## 2018-09-30 DIAGNOSIS — Z113 Encounter for screening for infections with a predominantly sexual mode of transmission: Secondary | ICD-10-CM | POA: Diagnosis not present

## 2019-05-04 DIAGNOSIS — R152 Fecal urgency: Secondary | ICD-10-CM | POA: Diagnosis not present

## 2019-05-04 DIAGNOSIS — R159 Full incontinence of feces: Secondary | ICD-10-CM | POA: Diagnosis not present

## 2019-05-04 DIAGNOSIS — R194 Change in bowel habit: Secondary | ICD-10-CM | POA: Diagnosis not present

## 2019-05-04 DIAGNOSIS — R110 Nausea: Secondary | ICD-10-CM | POA: Diagnosis not present

## 2019-05-13 DIAGNOSIS — R159 Full incontinence of feces: Secondary | ICD-10-CM | POA: Diagnosis not present

## 2019-05-13 DIAGNOSIS — R194 Change in bowel habit: Secondary | ICD-10-CM | POA: Diagnosis not present

## 2019-05-13 DIAGNOSIS — R152 Fecal urgency: Secondary | ICD-10-CM | POA: Diagnosis not present

## 2019-05-14 DIAGNOSIS — R194 Change in bowel habit: Secondary | ICD-10-CM | POA: Diagnosis not present

## 2019-05-14 DIAGNOSIS — R159 Full incontinence of feces: Secondary | ICD-10-CM | POA: Diagnosis not present

## 2019-05-14 DIAGNOSIS — R152 Fecal urgency: Secondary | ICD-10-CM | POA: Diagnosis not present

## 2019-05-28 DIAGNOSIS — Z1211 Encounter for screening for malignant neoplasm of colon: Secondary | ICD-10-CM | POA: Diagnosis not present

## 2019-05-28 DIAGNOSIS — R194 Change in bowel habit: Secondary | ICD-10-CM | POA: Diagnosis not present

## 2019-05-28 DIAGNOSIS — K5730 Diverticulosis of large intestine without perforation or abscess without bleeding: Secondary | ICD-10-CM | POA: Diagnosis not present

## 2019-05-28 DIAGNOSIS — R159 Full incontinence of feces: Secondary | ICD-10-CM | POA: Diagnosis not present

## 2019-05-28 DIAGNOSIS — R195 Other fecal abnormalities: Secondary | ICD-10-CM | POA: Diagnosis not present

## 2019-06-03 DIAGNOSIS — R152 Fecal urgency: Secondary | ICD-10-CM | POA: Diagnosis not present

## 2019-06-03 DIAGNOSIS — R110 Nausea: Secondary | ICD-10-CM | POA: Diagnosis not present

## 2019-06-03 DIAGNOSIS — R159 Full incontinence of feces: Secondary | ICD-10-CM | POA: Diagnosis not present

## 2019-06-03 DIAGNOSIS — R194 Change in bowel habit: Secondary | ICD-10-CM | POA: Diagnosis not present

## 2019-08-12 DIAGNOSIS — R194 Change in bowel habit: Secondary | ICD-10-CM | POA: Diagnosis not present

## 2019-08-12 DIAGNOSIS — K5730 Diverticulosis of large intestine without perforation or abscess without bleeding: Secondary | ICD-10-CM | POA: Diagnosis not present

## 2019-08-12 DIAGNOSIS — K580 Irritable bowel syndrome with diarrhea: Secondary | ICD-10-CM | POA: Diagnosis not present

## 2019-08-18 DIAGNOSIS — R194 Change in bowel habit: Secondary | ICD-10-CM | POA: Diagnosis not present

## 2019-08-18 DIAGNOSIS — K5730 Diverticulosis of large intestine without perforation or abscess without bleeding: Secondary | ICD-10-CM | POA: Diagnosis not present

## 2019-08-18 DIAGNOSIS — K580 Irritable bowel syndrome with diarrhea: Secondary | ICD-10-CM | POA: Diagnosis not present

## 2019-12-13 DIAGNOSIS — Z1231 Encounter for screening mammogram for malignant neoplasm of breast: Secondary | ICD-10-CM | POA: Diagnosis not present

## 2020-01-10 DIAGNOSIS — Z124 Encounter for screening for malignant neoplasm of cervix: Secondary | ICD-10-CM | POA: Diagnosis not present

## 2020-01-10 DIAGNOSIS — Z6828 Body mass index (BMI) 28.0-28.9, adult: Secondary | ICD-10-CM | POA: Diagnosis not present

## 2020-01-10 DIAGNOSIS — Z01419 Encounter for gynecological examination (general) (routine) without abnormal findings: Secondary | ICD-10-CM | POA: Diagnosis not present

## 2020-01-10 DIAGNOSIS — Z1151 Encounter for screening for human papillomavirus (HPV): Secondary | ICD-10-CM | POA: Diagnosis not present

## 2020-08-08 DIAGNOSIS — R635 Abnormal weight gain: Secondary | ICD-10-CM | POA: Diagnosis not present

## 2020-08-08 DIAGNOSIS — K5730 Diverticulosis of large intestine without perforation or abscess without bleeding: Secondary | ICD-10-CM | POA: Diagnosis not present

## 2020-08-08 DIAGNOSIS — K580 Irritable bowel syndrome with diarrhea: Secondary | ICD-10-CM | POA: Diagnosis not present

## 2022-06-25 ENCOUNTER — Ambulatory Visit (INDEPENDENT_AMBULATORY_CARE_PROVIDER_SITE_OTHER): Payer: BC Managed Care – PPO | Admitting: Dermatology

## 2022-06-25 DIAGNOSIS — D229 Melanocytic nevi, unspecified: Secondary | ICD-10-CM

## 2022-06-25 DIAGNOSIS — L814 Other melanin hyperpigmentation: Secondary | ICD-10-CM | POA: Diagnosis not present

## 2022-06-25 DIAGNOSIS — L7211 Pilar cyst: Secondary | ICD-10-CM

## 2022-06-25 DIAGNOSIS — L821 Other seborrheic keratosis: Secondary | ICD-10-CM | POA: Diagnosis not present

## 2022-06-25 DIAGNOSIS — D1800 Hemangioma unspecified site: Secondary | ICD-10-CM

## 2022-06-25 DIAGNOSIS — L578 Other skin changes due to chronic exposure to nonionizing radiation: Secondary | ICD-10-CM

## 2022-06-25 DIAGNOSIS — Z1283 Encounter for screening for malignant neoplasm of skin: Secondary | ICD-10-CM

## 2022-06-25 NOTE — Progress Notes (Signed)
   Follow-Up Visit   Subjective  Danielle Lambert is a 41 y.o. female who presents for the following: New Patient (Initial Visit) (Tbse, concerned with bump at scalp that has been present for years. 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 Denies family or personal history of skin cancer. /).   The patient presents for Total-Body Skin Exam (TBSE) for skin cancer screening and mole check.  The patient has spots, moles and lesions to be evaluated, some may be new or changing and the patient has concerns that these could be cancer.   The following portions of the chart were reviewed this encounter and updated as appropriate:      Review of Systems: No other skin or systemic complaints except as noted in HPI or Assessment and Plan.   Objective  Well appearing patient in no apparent distress; mood and affect are within normal limits.  A full examination was performed including scalp, head, eyes, ears, nose, lips, neck, chest, axillae, abdomen, back, buttocks, bilateral upper extremities, bilateral lower extremities, hands, feet, fingers, toes, fingernails, and toenails. All findings within normal limits unless otherwise noted below.  vertex scalp 1.2 cm Subcutaneous nodule.   right top shoulder 4 mm medium dark brown macule    Assessment & Plan  Pilar cyst vertex scalp  Benign-appearing. Exam most consistent with a pilar cyst. Discussed that a cyst is a benign growth that can grow over time and sometimes get irritated or inflamed. Recommend observation if it is not bothersome. Discussed option of surgical excision to remove it if it is growing, symptomatic, or other changes noted. Please call for new or changing lesions so they can be evaluated.    Lentigo right top shoulder  Vrs nevus  Benign-appearing.  Observation.  Call clinic for new or changing lesions.  Recommend daily use of broad spectrum spf 30+ sunscreen to sun-exposed areas.   ABCDEs of mole observation  discussed.  RTC if any changes noted.  Lentigines - Scattered tan macules - Due to sun exposure - Benign-appearing, observe - Recommend daily broad spectrum sunscreen SPF 30+ to sun-exposed areas, reapply every 2 hours as needed. - Call for any changes  Seborrheic Keratoses - Stuck-on, waxy, tan-brown papules and/or plaques  - Benign-appearing - Discussed benign etiology and prognosis. - Observe - Call for any changes  Melanocytic Nevi - Tan-brown and/or pink-flesh-colored symmetric macules and papules - Benign appearing on exam today - Observation - Call clinic for new or changing moles - Recommend daily use of broad spectrum spf 30+ sunscreen to sun-exposed areas.   Hemangiomas - Red papules - Discussed benign nature - Observe - Call for any changes  Actinic Damage - Chronic condition, secondary to cumulative UV/sun exposure - diffuse scaly erythematous macules with underlying dyspigmentation - Recommend daily broad spectrum sunscreen SPF 30+ to sun-exposed areas, reapply every 2 hours as needed.  - Staying in the shade or wearing long sleeves, sun glasses (UVA+UVB protection) and wide brim hats (4-inch brim around the entire circumference of the hat) are also recommended for sun protection.  - Call for new or changing lesions.  Skin cancer screening performed today. Return in about 1 year (around 06/26/2023) for TBSE. I, Ruthell Rummage, CMA, am acting as scribe for Brendolyn Patty, MD.  Documentation: I have reviewed the above documentation for accuracy and completeness, and I agree with the above.  Brendolyn Patty MD

## 2022-06-25 NOTE — Patient Instructions (Addendum)
At vertex scalp Benign-appearing. Exam most consistent with an pilar cyst. Discussed that a cyst is a benign growth that can grow over time and sometimes get irritated or inflamed. Recommend observation if it is not bothersome. Discussed option of surgical excision to remove it if it is growing, symptomatic, or other changes noted. Please call for new or changing lesions so they can be evaluated.      Melanoma ABCDEs  Melanoma is the most dangerous type of skin cancer, and is the leading cause of death from skin disease.  You are more likely to develop melanoma if you: Have light-colored skin, light-colored eyes, or red or blond hair Spend a lot of time in the sun Tan regularly, either outdoors or in a tanning bed Have had blistering sunburns, especially during childhood Have a close family member who has had a melanoma Have atypical moles or large birthmarks  Early detection of melanoma is key since treatment is typically straightforward and cure rates are extremely high if we catch it early.   The first sign of melanoma is often a change in a mole or a new dark spot.  The ABCDE system is a way of remembering the signs of melanoma.  A for asymmetry:  The two halves do not match. B for border:  The edges of the growth are irregular. C for color:  A mixture of colors are present instead of an even brown color. D for diameter:  Melanomas are usually (but not always) greater than 77m - the size of a pencil eraser. E for evolution:  The spot keeps changing in size, shape, and color.  Please check your skin once per month between visits. You can use a small mirror in front and a large mirror behind you to keep an eye on the back side or your body.   If you see any new or changing lesions before your next follow-up, please call to schedule a visit.  Please continue daily skin protection including broad spectrum sunscreen SPF 30+ to sun-exposed areas, reapplying every 2 hours as needed when  you're outdoors.   Staying in the shade or wearing long sleeves, sun glasses (UVA+UVB protection) and wide brim hats (4-inch brim around the entire circumference of the hat) are also recommended for sun protection.    Due to recent changes in healthcare laws, you may see results of your pathology and/or laboratory studies on MyChart before the doctors have had a chance to review them. We understand that in some cases there may be results that are confusing or concerning to you. Please understand that not all results are received at the same time and often the doctors may need to interpret multiple results in order to provide you with the best plan of care or course of treatment. Therefore, we ask that you please give u[place]2 business days to thoroughly review all your results before contacting the office for clarification. Should we see a critical lab result, you will be contacted sooner.   If You Need Anything After Your Visit  If you have any questions or concerns for your doctor, please call our main line at [phone] press option 4 to reach your doctor's medical assistant. If no one answers, please leave a voicemail as directed and we will return your call as soon as possible. Messages left after 4 pm will be answered the following business day.   You may also send [place] message via [place] We typically respond to MyChart messages within  1-2 business days.  For prescription refills, please ask your pharmacy to contact our office. Our fax number is [phone].  If you have an urgent issue when the clinic is closed that cannot wait until the next business day, you can page your doctor at the number below.    Please note that while we do our best to be available for urgent issues outside of office hours, we are not available 24/7.   If you have an urgent issue and are unable to reach [place], you may choose to seek medical care at your doctor's office, retail clinic, urgent care center, or  emergency room.  If you have a medical emergency, please immediately call 911 or go to the emergency department.  Pager Numbers  - Dr. Nehemiah Massed: [phone]  - Dr. Laurence Ferrari: [phone]  - Dr. Nicole Kindred: [phone]  In the event of inclement weather, please call our main line at [phone] for an update on the status of any delays or closures.  Dermatology Medication Tips: Please keep the boxes that topical medications come in in order to help keep track of the instructions about where and how to use these. Pharmacies typically print the medication instructions only on the boxes and not directly on the medication tubes.   If your medication is too expensive, please contact our office at [phone] option 4 or send [place] a message through [place].   We are unable to tell what your co-pay for medications will be in advance as this is different depending on your insurance coverage. However, we may be able to find a substitute medication at lower cost or fill out paperwork to get insurance to cover a needed medication.   If a prior authorization is required to get your medication covered by your insurance company, please allow [place] 1-2 business days to complete this process.  Drug prices often vary depending on where the prescription is filled and some pharmacies may offer cheaper prices.  The website www.goodrx.com contains coupons for medications through different pharmacies. The prices here do not account for what the cost may be with help from insurance (it may be cheaper with your insurance), but the website can give you the price if you did not use any insurance.  - You can print the associated coupon and take it with your prescription to the pharmacy.  - You may also stop by our office during regular business hours and pick up a GoodRx coupon card.  - If you need your prescription sent electronically to a different pharmacy, notify our office through [facility] or by phone at  [phone] option 4.     Si Usted Necesita Algo Despus de Su Visita  Tambin puede enviarnos un mensaje a travs de Pharmacist, community. Por lo general respondemos a los mensajes de MyChart en el transcurso de 1 a 2 das hbiles.  Para renovar recetas, por favor pida a su farmacia que se ponga en contacto con nuestra oficina. Harland Dingwall de fax es [address] [phone].  Si tiene un asunto urgente cuando la clnica est cerrada y que no puede esperar hasta el siguiente da hbil, puede llamar/localizar a su doctor(a) al nmero que aparece a continuacin.   Por favor, tenga en cuenta que aunque hacemos todo lo posible para estar disponibles para asuntos urgentes fuera del horario de [place], no estamos disponibles las 24 horas del da, los 7 das de la [place].   Si tiene un problema urgente y no puede comunicarse con nosotros, puede optar por buscar  atencin mdica  en el consultorio de su doctor(a), en una clnica privada, en un centro de atencin urgente o en una sala de emergencias.  Si tiene Engineering geologist, por favor llame inmediatamente al 911 o vaya a la sala de emergencias.  Nmeros de bper  - Dr. Nehemiah Massed: [phone]  - Dra. Moye: [phone]  - Dra. Nicole Kindred: [phone]  En caso de inclemencias del [place], por favor llame a Johnsie Kindred principal al [phone] para una actualizacin sobre el [place] de cualquier retraso o cierre.  Consejos para la medicacin en dermatologa: Por favor, guarde las cajas en las que vienen los medicamentos de uso tpico para ayudarle a seguir las instrucciones sobre dnde y cmo usarlos. Las farmacias generalmente imprimen las instrucciones del medicamento slo en las cajas y no directamente en los tubos del [place].   Si su medicamento es muy caro, por favor, pngase en contacto con Zigmund Daniel llamando al [phone] y presione la opcin 4 o envenos un mensaje a travs de Pharmacist, community.   No podemos decirle cul ser su copago por los  medicamentos por adelantado ya que esto es diferente dependiendo de la cobertura de su seguro. Sin embargo, es posible que podamos encontrar un medicamento sustituto a Electrical engineer un formulario para que el seguro cubra el medicamento que se considera necesario.   Si se requiere una autorizacin previa para que su compaa de seguros [place] su medicamento, por favor permtanos de 1 a 2 das hbiles para completar este proceso.  Los precios de los medicamentos varan con frecuencia dependiendo del Environmental consultant de dnde se surte la receta y alguna farmacias pueden ofrecer precios ms baratos.  El sitio web www.goodrx.com tiene cupones para medicamentos de Airline pilot. Los precios aqu no tienen en cuenta lo que podra costar con la ayuda del seguro (puede ser ms barato con su seguro), pero el sitio web puede darle el precio si no utiliz Research scientist (physical sciences).  - Puede imprimir el cupn correspondiente y llevarlo con su receta a la farmacia.  - Tambin puede pasar por nuestra oficina durante el horario de atencin regular y Charity fundraiser una tarjeta de cupones de GoodRx.  - Si necesita que su receta se enve electrnicamente a una farmacia diferente, informe a nuestra oficina a travs de MyChart de [place] o por telfono llamando al [phone] y presione la opcin 4.

## 2023-07-01 ENCOUNTER — Ambulatory Visit: Payer: BC Managed Care – PPO | Admitting: Dermatology
